# Patient Record
Sex: Male | Born: 1990 | Race: Black or African American | Hispanic: No | Marital: Single | Smoking: Current every day smoker
Health system: Southern US, Community
[De-identification: ages and names within clinical notes are randomized; demographics above are authoritative.]

## PROBLEM LIST (undated history)

## (undated) DIAGNOSIS — J45909 Unspecified asthma, uncomplicated: Secondary | ICD-10-CM

---
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
## 2010-05-06 ENCOUNTER — Emergency Department (HOSPITAL_COMMUNITY)
Admission: EM | Admit: 2010-05-06 | Discharge: 2010-05-06 | Payer: Self-pay | Source: Home / Self Care | Admitting: Emergency Medicine

## 2010-05-13 LAB — CBC
HCT: 45.9 % (ref 39.0–52.0)
Hemoglobin: 14.5 g/dL (ref 13.0–17.0)
MCH: 26.4 pg (ref 26.0–34.0)
MCHC: 31.6 g/dL (ref 30.0–36.0)
MCV: 83.6 fL (ref 78.0–100.0)
Platelets: 232 10*3/uL (ref 150–400)
RBC: 5.49 MIL/uL (ref 4.22–5.81)
RDW: 13.3 % (ref 11.5–15.5)
WBC: 4.7 10*3/uL (ref 4.0–10.5)

## 2010-05-13 LAB — DIFFERENTIAL
Basophils Absolute: 0 10*3/uL (ref 0.0–0.1)
Basophils Relative: 1 % (ref 0–1)
Eosinophils Absolute: 0.1 10*3/uL (ref 0.0–0.7)
Eosinophils Relative: 1 % (ref 0–5)
Lymphs Abs: 1.1 10*3/uL (ref 0.7–4.0)
Monocytes Absolute: 0.7 10*3/uL (ref 0.1–1.0)
Monocytes Relative: 15 % — ABNORMAL HIGH (ref 3–12)
Neutro Abs: 2.7 10*3/uL (ref 1.7–7.7)
Neutrophils Relative %: 59 % (ref 43–77)

## 2010-07-19 LAB — RPR: RPR Ser Ql: NONREACTIVE

## 2010-07-31 LAB — RAPID STREP SCREEN (MED CTR MEBANE ONLY): Streptococcus, Group A Screen (Direct): NEGATIVE

## 2010-09-13 NOTE — H&P (Signed)
NAME:  LEVITICUS, HARTON NO.:  [id number]   MEDICAL RECORD NO.:  [id number]          PATIENT TYPE:  INP   LOCATION:  0204                          FACILITY:  [facility]   PHYSICIAN:  Lalla Brothers, MDDATE OF BIRTH:  1990-09-13   DATE OF ADMISSION:  03/07/2006  DATE OF DISCHARGE:                         PSYCHIATRIC ADMISSION ASSESSMENT   IDENTIFICATION:  Al-Bari Skop is a 20 year old African-American young  male who was a 10th grader in [employer], living with his mother.  His chief complaints are depression and suicidal ideation and substance  abuse.   HISTORY OF PRESENT ILLNESS:  Al-Bari is a 20 year old black male who  presented with symptoms of depression, oppositional defiant behavior,  substance abuse, skipping school, interpersonal relationship problems,  brought in by his mother to the [facility] and initially  screened by the evaluation team and admitted into the [facility] on a voluntary basis with a diagnosis of mood disorder, not  otherwise specified, and suicidal ideations with a plan of cutting himself.  The patient made a threatening statement that he wanted to kill himself with  cutting his wrists.  The patient reported that he has been having  significant stress, depression, crying spells, causing him not to sleep well  and getting irritable, not able to concentrate, and suicidal thoughts.  He  stated that he felt like his mom does not want him because he is being given  lot of punishments as a disciplinary method, taking away stuff, grounding  him more frequently.  He became rebellious since this summer, started  smoking marijuana, selling marijuana, skipping school, hanging around with  wrong people.  He was suspended from the school at least twice from [employer].  His mom got upset and mad with him.  He has been more  argumentative with stress affecting oppositional and defiant with  her.  The  patient stated that he does not want to go to school, saying that it is  boring, is not interesting anymore, and he likes selling the marijuana and  making money and is keeping his money for himself, but could not reveal the  purpose of saving money and what makes him bored in the classroom.  He  stated that he has hallucinations about three days ago, a male voice  telling him to hurt himself.  The patient's mother is worried even more  about the patient as he is stating the need for help.   PAST PSYCHIATRIC HISTORY:  The patient reported he was never being treated  either at acute inpatient psychiatric hospital or outpatient psychiatric  treatment.   PAST MEDICAL HISTORY:  1. The patient has been diagnosed with asthma and seasonal allergies.  2. The patient reported he had a seizure about eight years ago but no      further treatment required.  He reported no head injuries or motor      vehicle accident.   SUBSTANCE ABUSE HISTORY:  The patient reported that he has been using  marijuana since the summer and has stated that he is using once or twice a  week and he is also selling outside the school, but has denied any charges  against selling or using drugs.   ALLERGIES:  He has no known drug allergies.   CURRENT MEDICATIONS:  1. Allegra as needed.  2. Advair as needed.   FAMILY HISTORY:  Significant for depression in his biological mother who has  been admitted at the [facility] a few times.  The patient's  dad lives with his 74 year old half sister in [place].  He has a 23-year-  old brother who was killed in a motor vehicle accident about eight years  ago.  He has a 33 year old brother grown up, living on his own, as he has no  relationship with him.  He stated he has a fairly good relationship with his  biological father and visits him during the holidays.  He stated he has a  relationship problem with his mother.  It is okay except arguments  and  defiant behavior related to disciplinary methods.  The patient denied any  history of victim of abuse or legal history.   MENTAL STATUS EXAM:  This is a 20 year old African-American male who  appeared as per stated age, tall, thin, no dysmorphism or no involuntary  movements.  He has short hair, casually dressed, fairly groomed.  His motor  activity mildly increased.  He has poor eye contact during the first few  minutes of the interview.  His relationship to the examiner gradually  improved over the interview time.  His stated mood is irritable.  His affect  is appropriate, no lability.  Intensity is moderate.  He has normal rate,  rhythm, and volume of speech.  His thought processes are linear, goal-  directed without loosening of associations or flight of ideas.  Thought  content:  He wanted to cut himself because he feels his mother does not like  him, does not want him.  He also had a plan of cutting his wrists.  He  reported hearing auditory hallucinations about three days ago but denied  currently.  He denied any delusional content of his thoughts.  Cognition  seems to be intact.  He is awake, alert, oriented to time, place, and  person.  He has good concentration, memory, and abstract thinking based on  formal questions.   DIAGNOSES:  Axis I:  1. Mood disorder, not otherwise specified.  2. Psychotic disorder, psychosis, not otherwise specified.  3. Marijuana abuse.  4. Substance-induced mood disorder.  Axis II:  Deferred.  Axis III:  1. Asthma.  2. Seasonal allergies.  Axis IV:  Academic and behavior problems, community support, family support,  substance abuse, parent-child relation problems.  Axis V:  Less than 30.   Estimated length of inpatient treatment:  5-7 days.   TREATMENT RECOMMENDATIONS:  Recommended to provide safe and secure  environment and therapeutic milieu.  The patient will be receiving multisystemic, multiple model treatment modalities including  individual  therapy, group therapy, family therapy, and substance abuse counseling.  The  patient will be discharged when free from depression, anxiety, stress, and  suicidal ideations.  During the family therapy medication treatment will be  discussed.  Upon agreement may start anticipated medications.    ______________________________  Conni Slipper, MD      Lalla Brothers, MD  Electronically Signed   JRJ/MEDQ  D:  03/08/2006  T:  03/08/2006  Job:  [id number]

## 2010-09-13 NOTE — Discharge Summary (Signed)
NAME:  Zachary Robinson, Zachary Robinson NO.:  [id number]   MEDICAL RECORD NO.:  [id number]          PATIENT TYPE:  INP   LOCATION:  0204                          FACILITY:  [facility]   PHYSICIAN:  Lalla Brothers, MDDATE OF BIRTH:  1990/11/02   DATE OF ADMISSION:  03/07/2006  DATE OF DISCHARGE:  03/14/2006                                 DISCHARGE SUMMARY   IDENTIFICATION:  A 40-20/20-year-old male ninth grade student at [employer] though failing and skipping was admitted emergently voluntarily from  access and intake crisis at [facility] when brought by mother  for suicide plan to cut his wrist and depression.  The patient had been  using and selling cannabis in association with negative peers since the  summer of 05-Oct-2005 with progressive consequences.  He reported having a seizure  8 years ago but required no further evaluation or treatment.  There is  significant family history of depression with mother on medications  requiring recurrent hospitalization for depressive episodes.  For full  details, please see the typed admission assessment by Dr. Elsie Saas.   SYNOPSIS OF PRESENT ILLNESS:  The patient resides with mother with father  being in [place].  Brother died in 1999-10-06 in an auto accident. The patient  did have some therapy at that time being very close to the brother.  The  family moved from [place] to [place] when the patient was 20 years  of age and maternal grandfather died in 66 and godmother and 86.  Mother  knows the patient is depressed but he copes primarily by oppositionality and  cannabis.  The patient feels like a failure.  There is a maternal family  history of schizophrenia and borderline personality including maternal  grandmother having mental illness as well as maternal grandfather substance  abuse.  Mother reports that the patient's last therapy was 2 years ago.  The  patient has used Allegra and Advair as needed for  allergic rhinitis and  asthma.  He is also taking Vistaril for insomnia at times.  He had a left  upper extremity fracture in 10/06/02.  He has also had an albuterol inhaler as  needed.  He acknowledges sexual activity.   INITIAL MENTAL STATUS EXAM:  The patient had mild to moderate depression  initially though with poor eye contact and limited verbal elaboration on  questions.  He was irritable, wanting to cut himself because he feels mother  does not want or like him.  He acknowledged a plan to cut his wrist.  He  reported auditory hallucinations 3 days before but none subsequently.  He  had no hypomanic diathesis.   LABORATORY FINDINGS:  CBC was normal except eosinophils elevated at 5% with  upper limit of normal 4%.  White count was normal at 6000, hemoglobin 13.7,  MCV of 82 and platelet count 275,000.  Comprehensive metabolic panel was  normal with sodium 138, potassium 3.7, random glucose 100, creatinine 0.9,  indirect bilirubin 0.8, calcium 9.8, albumin 3.8, AST 18 and ALT 10.  Free  T4 was normal at 1.29 and TSH at 3.682.  Urine  drug screen was positive for  marijuana metabolites quantitated and confirmed at 8 ng/mL otherwise drug  screen was negative with creatinine of 165 mg/dL documenting adequate  specimen.  Urinalysis revealed specific gravity of 1.019 with pH 6,  otherwise negative except moderate leukocyte esterase and 11-20 WBC with  mucus present.  Urine probe for gonorrhea and chlamydia trachomatis by DNA  amplification was positive for chlamydia but negative for gonorrhea.  RPR  was nonreactive..  Electrocardiogram 3 days prior to discharge for  bradycardia at rest ranging from 45-68 beats per minute revealed normal  sinus rhythm, normal EKG with rate of 69, PR of 150, QRS of 94 and QTC 394  milliseconds as interpreted by Dr. Rosiland Oz.   HOSPITAL COURSE AND TREATMENT:  General medical exam by Jorje Guild, PA-C  revealed no asthma evident at this time.  The patient  reported cannabis  weekly.  His BMI was 21.4.  Exam was otherwise intact.  Height was 68 inches  with weight of 141 pounds with subsequent weight 140 pounds.  Blood pressure  was initially 108/59 with heart rate of 64 supine and 101/52 with heart rate  of 74 standing.  On the fourth hospital day, supine blood pressure was  111/66 with heart rate of 45 and standing blood pressure 104/63 with heart  rate of 70.  On the day of discharge, supine blood pressure was 112/60 with  heart rate of 59 and standing blood pressure 101/55 with heart rate of 95.  Mother and patient repeatedly declined Wellbutrin, Luvox or alternative  antidepressant for the patient.  The patient remained somewhat guarded  throughout the hospital stay though he did open up more in group and milieu  therapies as a hospitalization proceeded.  He did open up in family therapy  with mother and mother reached a conclusion that the patient would switch to  adult high school.  The patient did allow treatment process to address his  intrapsychic conflicts and to reestablish a loving and trusting relationship  with mother.  The need for sobriety from cannabis was addressed and the  reality of the significance of his current drug level of cannabis was  addressed.  He was treated with Zithromax 1000 mg for his chlamydia and  retained and tolerated it well.  He was educated on the chlamydia and shared  this information with mother and will address it with his contacts as well.  Suicidal ideation remitted and he required no treatment for asthma during  the hospital stay.  He was discharged in improved condition requiring no  seclusion or restraint during the hospital stay.  He had no suicide or  homicidal ideation at the time of discharge and had no hallucinations  throughout the hospital stay.   FINAL DIAGNOSES:  AXIS I:  1. Dysthymic disorder, early onset, severe with atypical features.  2. Oppositional defiant disorder.  3.  Cannabis abuse. 4. Other interpersonal problem.  5. Parent child problem.  6. Other specified family circumstances.  AXIS II:  Diagnosis deferred.  AXIS III:  1. Seasonal allergic rhinitis and asthma.  2. Asymptomatic chlamydia urethritis.  3. Resting sinus bradycardia in the morning of no clinical significance.  AXIS IV:  Stressors, family severe acute and chronic; school severe acute  and chronic; phase of life severe acute and chronic.  AXIS V:  GAF on admission 30 with highest in the last year estimated at 62  and discharge GAF was 53.   PLAN:  The patient was  discharged to mother in improved condition free of  suicidal ideation.  He follows a regular diet and has no restriction on  physical activity.  Crisis and safety plans are outlined if needed.  He has  albuterol and Advair inhalers and Allegra at home if needed for his allergic  rhinitis and asthma.  He will assure that his contacts are assessed and  treated relative to the chlamydia urethritis.  He will abstain from cannabis  and continue to address anger management and substance abuse in his ongoing  aftercare as well as his depression.  He will see Del[facility] Gr[facility] March 25, 2006 at 10:00 a.m. and can  have psychiatric follow-up if he and mother do become willing to start  pharmacotherapy for depression.  Such as Luvox or Wellbutrin.  Crisis and  safety plans are outlined if needed.      Lalla Brothers, MD  Electronically Signed     GEJ/MEDQ  D:  03/18/2006  T:  03/18/2006  Job:  [id number]   cc:   Delphia Grates  [facility]  [address]  [place], [place] [address]

## 2011-01-03 ENCOUNTER — Emergency Department (HOSPITAL_COMMUNITY)
Admission: EM | Admit: 2011-01-03 | Discharge: 2011-01-03 | Disposition: A | Discharge status: Home / Self Care | Payer: Self-pay | Attending: Emergency Medicine | Admitting: Emergency Medicine

## 2011-01-03 DIAGNOSIS — R6883 Chills (without fever): Secondary | ICD-10-CM | POA: Insufficient documentation

## 2011-01-03 DIAGNOSIS — R05 Cough: Secondary | ICD-10-CM | POA: Insufficient documentation

## 2011-01-03 DIAGNOSIS — R059 Cough, unspecified: Secondary | ICD-10-CM | POA: Insufficient documentation

## 2011-01-03 DIAGNOSIS — R599 Enlarged lymph nodes, unspecified: Secondary | ICD-10-CM | POA: Insufficient documentation

## 2011-01-03 DIAGNOSIS — J029 Acute pharyngitis, unspecified: Secondary | ICD-10-CM | POA: Insufficient documentation

## 2011-01-03 LAB — RAPID STREP SCREEN (MED CTR MEBANE ONLY): Streptococcus, Group A Screen (Direct): NEGATIVE

## 2011-01-27 LAB — RAPID STREP SCREEN (MED CTR MEBANE ONLY): Streptococcus, Group A Screen (Direct): NEGATIVE

## 2011-02-07 LAB — STREP A DNA PROBE: Group A Strep Probe: NEGATIVE

## 2011-02-07 LAB — RAPID STREP SCREEN (MED CTR MEBANE ONLY): Streptococcus, Group A Screen (Direct): NEGATIVE

## 2012-08-04 ENCOUNTER — Emergency Department (HOSPITAL_COMMUNITY)
Admission: EM | Admit: 2012-08-04 | Discharge: 2012-08-05 | Disposition: A | Discharge status: Home / Self Care | Payer: Self-pay | Attending: Emergency Medicine | Admitting: Emergency Medicine

## 2012-08-04 ENCOUNTER — Encounter (HOSPITAL_COMMUNITY): Payer: Self-pay | Admitting: Emergency Medicine

## 2012-08-04 DIAGNOSIS — F17200 Nicotine dependence, unspecified, uncomplicated: Secondary | ICD-10-CM | POA: Insufficient documentation

## 2012-08-04 DIAGNOSIS — K121 Other forms of stomatitis: Secondary | ICD-10-CM | POA: Insufficient documentation

## 2012-08-04 DIAGNOSIS — K1230 Oral mucositis (ulcerative), unspecified: Secondary | ICD-10-CM | POA: Insufficient documentation

## 2012-08-04 DIAGNOSIS — B079 Viral wart, unspecified: Secondary | ICD-10-CM | POA: Insufficient documentation

## 2012-08-04 NOTE — ED Notes (Signed)
Pt ambulatory to br

## 2012-08-04 NOTE — ED Notes (Signed)
Pt requesting STD testing.  Reports bumps on the back of his tongue and bumps on penis x 3 weeks.  Also reports burning with urination.

## 2012-08-05 MED ORDER — NYSTATIN 100000 UNIT/ML MT SUSP: 5.0000 mL | Freq: Four times a day (QID) | OROMUCOSAL | Status: AC

## 2012-08-05 MED ORDER — NYSTATIN 100000 UNIT/ML MT SUSP
5.0000 mL | Freq: Once | OROMUCOSAL | Status: AC
  Administered 2012-08-05: 500000 [IU] via ORAL
  Filled 2012-08-05: qty 5

## 2012-08-05 NOTE — ED Notes (Signed)
Pt dc to home.   Pt states understanding to dc instructions.  Pt ambulatory to exit without difficulty.  Pt denies need for w/c. 

## 2012-08-05 NOTE — ED Provider Notes (Signed)
History     CSN: [id number]  Arrival date & time 08/04/12  2124   First MD Initiated Contact with Patient 08/05/12 0036      Chief Complaint  Patient presents with  . SEXUALLY TRANSMITTED DISEASE    (Consider location/radiation/quality/duration/timing/severity/associated sxs/prior treatment) HPI Comments: Patient presents reporting, "bumps" on the shaft of his penis.  They are nonpainful and dark in color.  He notes that 3-4 weeks, ago.  He also has had some oral soreness, and lesions.  His sexual partner.  Does not have the symptoms vaginal discharge vaginal lesions  The history is provided by the patient.    History reviewed. No pertinent past medical history.  History reviewed. No pertinent past surgical history.  No family history on file.  History  Substance Use Topics  . Smoking status: Current Every Day Smoker  . Smokeless tobacco: Not on file  . Alcohol Use: No      Review of Systems  Constitutional: Negative for fever.  HENT: Positive for mouth sores. Negative for sore throat and trouble swallowing.   Genitourinary: Negative for dysuria, penile swelling, scrotal swelling, penile pain and testicular pain.  All other systems reviewed and are negative.    Allergies  Review of patient's allergies indicates not on file.  Home Medications   Current Outpatient Rx  Name  Route  Sig  Dispense  Refill  . nystatin (MYCOSTATIN) 100000 UNIT/ML suspension   Oral   Take 5 mLs (500,000 Units total) by mouth 4 (four) times daily.   60 mL   0     BP 116/48  Pulse 77  Temp(Src) 98.4 F (36.9 C) (Oral)  SpO2 100%  Physical Exam  Nursing note and vitals reviewed. Constitutional: He appears well-developed.  HENT:  Head: Normocephalic.  At the very posterior edges of the tongue.  Patient has 2 reddened shiny, nonpainful lesions, consistent with stomatitis.  We'll treat with nystatin  Eyes: Pupils are equal, round, and reactive to light.  Neck: Normal range of  motion.  Cardiovascular: Normal rate.   Pulmonary/Chest: Effort normal.  Abdominal: Soft.  Genitourinary: Penis normal. No penile tenderness.  Patient has to work late, lesions on the shaft of his penis or nonpainful without surrounding erythema  Musculoskeletal: Normal range of motion.  Neurological: He is alert.  Skin: Skin is warm.    ED Course  Procedures (including critical care time)  Labs Reviewed - No data to display No results found.   1. Viral warts, unspecified   2. Stomatitis       MDM   Patient.  Has not had any penile discharge or lesions are inconsistent with a syphilitic chancre.  Or herpes lesions        Arman Filter, NP 08/05/12 [phone]

## 2012-08-05 NOTE — ED Provider Notes (Signed)
Medical screening examination/treatment/procedure(s) were performed by non-physician practitioner and as supervising physician I was immediately available for consultation/collaboration.   David H Yao, MD 08/05/12 0602 

## 2013-01-08 ENCOUNTER — Encounter (HOSPITAL_COMMUNITY): Payer: Self-pay

## 2013-01-08 ENCOUNTER — Emergency Department (HOSPITAL_COMMUNITY)
Admission: EM | Admit: 2013-01-08 | Discharge: 2013-01-08 | Disposition: A | Discharge status: Home / Self Care | Payer: Self-pay | Attending: Emergency Medicine | Admitting: Emergency Medicine

## 2013-01-08 DIAGNOSIS — R369 Urethral discharge, unspecified: Secondary | ICD-10-CM

## 2013-01-08 DIAGNOSIS — A64 Unspecified sexually transmitted disease: Secondary | ICD-10-CM | POA: Insufficient documentation

## 2013-01-08 DIAGNOSIS — J45909 Unspecified asthma, uncomplicated: Secondary | ICD-10-CM | POA: Insufficient documentation

## 2013-01-08 DIAGNOSIS — F17200 Nicotine dependence, unspecified, uncomplicated: Secondary | ICD-10-CM | POA: Insufficient documentation

## 2013-01-08 HISTORY — DX: Unspecified asthma, uncomplicated: J45.909

## 2013-01-08 LAB — URINALYSIS, ROUTINE W REFLEX MICROSCOPIC
Bilirubin Urine: NEGATIVE
Glucose, UA: NEGATIVE mg/dL
Hgb urine dipstick: NEGATIVE
Ketones, ur: NEGATIVE mg/dL
Nitrite: NEGATIVE
Protein, ur: 100 mg/dL — AB
Specific Gravity, Urine: 1.034 — ABNORMAL HIGH (ref 1.005–1.030)
Urobilinogen, UA: 1 mg/dL (ref 0.0–1.0)
pH: 5.5 (ref 5.0–8.0)

## 2013-01-08 LAB — URINE MICROSCOPIC-ADD ON

## 2013-01-08 MED ORDER — DOXYCYCLINE HYCLATE 100 MG PO CAPS: 100.0000 mg | ORAL_CAPSULE | Freq: Two times a day (BID) | ORAL | Status: DC

## 2013-01-08 MED ORDER — CEFTRIAXONE SODIUM 250 MG IJ SOLR
250.0000 mg | Freq: Once | INTRAMUSCULAR | Status: AC
  Administered 2013-01-08: 250 mg via INTRAMUSCULAR

## 2013-01-08 NOTE — ED Provider Notes (Signed)
CSN: [id number]     Arrival date & time 01/08/13  0306 History   First MD Initiated Contact with Patient 01/08/13 0404     Chief Complaint  Patient presents with  . Penile Discharge   (Consider location/radiation/quality/duration/timing/severity/associated sxs/prior Treatment) HPI Patient arrives via EMS for complaints of greenish yellow penile discharge that he noticed tonight. He's had burning with urination. Patient states he is sexually active and does not use condoms. He denies any type of genital masses or lesions. Denies abdominal pain, nausea, vomiting, diarrhea, fevers, chills. Patient has occasional testicular pain but states he is having no pain currently. Patient has been treated for Chlamydia in the past. Past Medical History  Diagnosis Date  . Asthma    History reviewed. No pertinent past surgical history. History reviewed. No pertinent family history. History  Substance Use Topics  . Smoking status: Current Every Day Smoker  . Smokeless tobacco: Not on file  . Alcohol Use: No    Review of Systems  Constitutional: Negative for fever.  Gastrointestinal: Negative for nausea, vomiting and abdominal pain.  Genitourinary: Positive for dysuria, discharge and testicular pain. Negative for frequency and hematuria.  Skin: Negative for rash and wound.  All other systems reviewed and are negative.    Allergies  Review of patient's allergies indicates no known allergies.  Home Medications   Current Outpatient Rx  Name  Route  Sig  Dispense  Refill  . ibuprofen (ADVIL,MOTRIN) 200 MG tablet   Oral   Take 400 mg by mouth every 6 (six) hours as needed for pain or fever.         . doxycycline (VIBRAMYCIN) 100 MG capsule   Oral   Take 1 capsule (100 mg total) by mouth 2 (two) times daily.   20 capsule   0    BP 100/42  Pulse 69  Temp(Src) 98.5 F (36.9 C) (Oral)  Resp 18  SpO2 98% Physical Exam  Nursing note and vitals reviewed. Constitutional: He is oriented  to person, place, and time. He appears well-developed and well-nourished. No distress.  HENT:  Head: Normocephalic and atraumatic.  Mouth/Throat: Oropharynx is clear and moist.  Eyes: EOM are normal. Pupils are equal, round, and reactive to light.  Neck: Normal range of motion. Neck supple.  Cardiovascular: Normal rate and regular rhythm.   Pulmonary/Chest: Effort normal and breath sounds normal. No respiratory distress. He has no wheezes. He has no rales.  Abdominal: Soft. Bowel sounds are normal. He exhibits no distension and no mass. There is no tenderness. There is no rebound and no guarding.  Genitourinary: No penile tenderness.  No inguinal lymphadenopathy. No genital lesions. The testicular tenderness or swelling. Patient has a white penile discharge.  Musculoskeletal: Normal range of motion. He exhibits no edema and no tenderness.  Neurological: He is alert and oriented to person, place, and time.  Skin: Skin is warm and dry. No rash noted. No erythema.  Psychiatric: He has a normal mood and affect. His behavior is normal.    ED Course  Procedures (including critical care time) Labs Review Labs Reviewed  URINALYSIS, ROUTINE W REFLEX MICROSCOPIC - Abnormal; Notable for the following:    Color, Urine AMBER (*)    APPearance CLOUDY (*)    Specific Gravity, Urine 1.034 (*)    Protein, ur 100 (*)    Leukocytes, UA LARGE (*)    All other components within normal limits  URINE MICROSCOPIC-ADD ON - Abnormal; Notable for the following:  Casts HYALINE CASTS (*)    All other components within normal limits  GC/CHLAMYDIA PROBE AMP  URINE CULTURE   Imaging Review No results found.  MDM  Given penal discharge go ahead and treat for STD. Patient is encouraged to have sexual partners evaluated at the health clinic and treated. Safe sex practices have been encouraged.    Loren Racer, MD 01/08/13 [phone]

## 2013-01-08 NOTE — ED Notes (Signed)
Per EMS, pt called 911 without mother's knowledge.  Pt was in bed asleep.  Pt woke up to go to bathroom and had a yellowish-greenish d/c from penis.  Pt states no difficulty urinating but did have pressure in abdomen after voiding.  Vitals:  110 bp palpated, 82, 16, 100%

## 2013-01-08 NOTE — ED Notes (Signed)
Pt states, up in middle of night to use restroom and noticed the d/c.  Pt states sexually active and not using condoms currently.  Pt has been with partner for 2 months.

## 2013-01-09 LAB — URINE CULTURE
Colony Count: NO GROWTH
Culture: NO GROWTH

## 2013-01-10 LAB — GC/CHLAMYDIA PROBE AMP
CT Probe RNA: POSITIVE — AB
GC Probe RNA: POSITIVE — AB

## 2013-01-15 ENCOUNTER — Telehealth (HOSPITAL_COMMUNITY): Payer: Self-pay | Admitting: *Deleted

## 2013-01-16 ENCOUNTER — Telehealth (HOSPITAL_COMMUNITY): Payer: Self-pay | Admitting: Emergency Medicine

## 2013-01-18 NOTE — ED Notes (Signed)
Patient informed of positive results after IDx 2 and informed of need to notify partner to be treated.

## 2013-04-05 ENCOUNTER — Encounter (HOSPITAL_COMMUNITY): Payer: Self-pay | Admitting: Emergency Medicine

## 2013-04-05 ENCOUNTER — Emergency Department (HOSPITAL_COMMUNITY)
Admission: EM | Admit: 2013-04-05 | Discharge: 2013-04-05 | Disposition: A | Discharge status: Home / Self Care | Payer: Self-pay | Attending: Emergency Medicine | Admitting: Emergency Medicine

## 2013-04-05 DIAGNOSIS — J45909 Unspecified asthma, uncomplicated: Secondary | ICD-10-CM | POA: Insufficient documentation

## 2013-04-05 DIAGNOSIS — J020 Streptococcal pharyngitis: Secondary | ICD-10-CM | POA: Insufficient documentation

## 2013-04-05 DIAGNOSIS — Z792 Long term (current) use of antibiotics: Secondary | ICD-10-CM | POA: Insufficient documentation

## 2013-04-05 DIAGNOSIS — F17200 Nicotine dependence, unspecified, uncomplicated: Secondary | ICD-10-CM | POA: Insufficient documentation

## 2013-04-05 LAB — RAPID STREP SCREEN (MED CTR MEBANE ONLY): Streptococcus, Group A Screen (Direct): NEGATIVE

## 2013-04-05 MED ORDER — IBUPROFEN 600 MG PO TABS: 600.0000 mg | ORAL_TABLET | Freq: Four times a day (QID) | ORAL | Status: DC | PRN

## 2013-04-05 MED ORDER — TRAMADOL HCL 50 MG PO TABS: 50.0000 mg | ORAL_TABLET | Freq: Four times a day (QID) | ORAL | Status: DC | PRN

## 2013-04-05 MED ORDER — PENICILLIN G BENZATHINE 1200000 UNIT/2ML IM SUSP
1.2000 10*6.[IU] | Freq: Once | INTRAMUSCULAR | Status: AC
  Administered 2013-04-05: 1.2 10*6.[IU] via INTRAMUSCULAR
  Filled 2013-04-05: qty 2

## 2013-04-05 NOTE — ED Provider Notes (Signed)
CSN: [id number]     Arrival date & time 04/05/13  0840 History  This chart was scribed for non-physician practitioner working with Derwood Kaplan, MD by Ashley Jacobs, ED scribe. This patient was seen in room TR06C/TR06C and the patient's care was started at 9:17 AM.  None    Chief Complaint  Patient presents with  . Sore Throat   (Consider location/radiation/quality/duration/timing/severity/associated sxs/prior Treatment) HPI HPI Comments: Zachary Robinson is a 22 y.o. male who presents to the Emergency Department complaining of constant, severe throat pain for the past three days. He reports the associated symptoms of chills, fever (102), and intermittent cough. Pt states feeling a "bump" at the back of his throat that is painful and irritating. He has tried Advil for pain with temporary relief. Pt smokes tobacco every day and does not drink alcohol. He does not have any known allergies to mediations or any prior medical complications.  Past Medical History  Diagnosis Date  . Asthma    History reviewed. No pertinent past surgical history. History reviewed. No pertinent family history. History  Substance Use Topics  . Smoking status: Current Every Day Smoker  . Smokeless tobacco: Not on file  . Alcohol Use: No    Review of Systems  Constitutional: Positive for fever and chills. Negative for fatigue.  HENT: Positive for sore throat. Negative for congestion, ear pain, rhinorrhea and sinus pressure.   Eyes: Negative for redness.  Respiratory: Positive for cough. Negative for wheezing.   Gastrointestinal: Negative for nausea, vomiting, abdominal pain and diarrhea.  Genitourinary: Negative for dysuria.  Musculoskeletal: Negative for myalgias and neck stiffness.  Skin: Negative for rash.  Neurological: Negative for headaches.  Hematological: Negative for adenopathy.    Allergies  Review of patient's allergies indicates no known allergies.  Home Medications   Current  Outpatient Rx  Name  Route  Sig  Dispense  Refill  . doxycycline (VIBRAMYCIN) 100 MG capsule   Oral   Take 1 capsule (100 mg total) by mouth 2 (two) times daily.   20 capsule   0   . ibuprofen (ADVIL,MOTRIN) 200 MG tablet   Oral   Take 400 mg by mouth every 6 (six) hours as needed for pain or fever.          BP 102/52  Pulse 75  Temp(Src) 99.1 F (37.3 C) (Oral)  Resp 18  SpO2 100% Physical Exam  Nursing note and vitals reviewed. Constitutional: He appears well-developed and well-nourished. No distress.  HENT:  Head: Normocephalic and atraumatic.  Mouth/Throat: No uvula swelling. Oropharyngeal exudate, posterior oropharyngeal edema and posterior oropharyngeal erythema present. No tonsillar abscesses.  Eyes: EOM are normal. Pupils are equal, round, and reactive to light.  Neck: Normal range of motion. Neck supple. Erythema present. No tracheal deviation present.  Erythema of the tonsils with exudates  cervical adenopathy present  Cardiovascular: Normal rate.   Pulmonary/Chest: Effort normal. No respiratory distress.  Abdominal: Soft. He exhibits no distension.  Musculoskeletal: Normal range of motion.  Lymphadenopathy:    He has cervical adenopathy.  Neurological: He is alert.  Skin: Skin is warm and dry.  Psychiatric: He has a normal mood and affect. His behavior is normal.    ED Course  Procedures (including critical care time) DIAGNOSTIC STUDIES: Oxygen Saturation is 100% on room air, normal by my interpretation.    COORDINATION OF CARE: 9:21 AM Discussed course of care with pt which includes Rapid Strep test, Bicillin LA injection, Motrin 600 mg and Ultram  50 mg. Pt understands and agrees.  Labs Review Labs Reviewed  RAPID STREP SCREEN  CULTURE, GROUP A STREP   Imaging Review No results found.  EKG Interpretation   None      Patient seen and examined. CENTOR 4/4. Pt agrees with IM Bicillin treatment. Medications ordered.   Vital signs reviewed and  are as follows: Filed Vitals:   04/05/13 0853  BP: 102/52  Pulse: 75  Temp: 99.1 F (37.3 C)  Resp: 18   Patient urged to return with worsening symptoms or other concerns. Patient verbalized understanding and agrees with plan.   Patient counseled on use of narcotic pain medications. Counseled not to combine these medications with others containing tylenol. Urged not to drink alcohol, drive, or perform any other activities that requires focus while taking these medications. The patient verbalizes understanding and agrees with the plan.   MDM   1. Streptococcal pharyngitis    CENTOR 4/4, no complicating abscess. No resp compromise. Non-toxic appearance.   I personally performed the services described in this documentation, which was scribed in my presence. The recorded information has been reviewed and is accurate.     Renne Crigler, PA-C 04/06/13 [phone]

## 2013-04-05 NOTE — ED Notes (Signed)
Per EMS: pt from home c/o sore throat with fever x 2 days

## 2013-04-07 LAB — CULTURE, GROUP A STREP

## 2013-04-08 NOTE — ED Provider Notes (Signed)
Medical screening examination/treatment/procedure(s) were performed by non-physician practitioner and as supervising physician I was immediately available for consultation/collaboration.  EKG Interpretation   None        Raylee Strehl, MD 04/08/13 1803 

## 2014-07-08 ENCOUNTER — Emergency Department (HOSPITAL_COMMUNITY)
Admission: EM | Admit: 2014-07-08 | Discharge: 2014-07-08 | Disposition: A | Discharge status: Home / Self Care | Payer: Self-pay | Attending: Emergency Medicine | Admitting: Emergency Medicine

## 2014-07-08 ENCOUNTER — Encounter (HOSPITAL_COMMUNITY): Payer: Self-pay | Admitting: Family Medicine

## 2014-07-08 DIAGNOSIS — Z720 Tobacco use: Secondary | ICD-10-CM | POA: Insufficient documentation

## 2014-07-08 DIAGNOSIS — N342 Other urethritis: Secondary | ICD-10-CM | POA: Insufficient documentation

## 2014-07-08 DIAGNOSIS — J45909 Unspecified asthma, uncomplicated: Secondary | ICD-10-CM | POA: Insufficient documentation

## 2014-07-08 DIAGNOSIS — R369 Urethral discharge, unspecified: Secondary | ICD-10-CM | POA: Insufficient documentation

## 2014-07-08 DIAGNOSIS — Z202 Contact with and (suspected) exposure to infections with a predominantly sexual mode of transmission: Secondary | ICD-10-CM | POA: Insufficient documentation

## 2014-07-08 MED ORDER — LIDOCAINE HCL (PF) 1 % IJ SOLN
5.0000 mL | Freq: Once | INTRAMUSCULAR | Status: AC
  Administered 2014-07-08: 5 mL

## 2014-07-08 MED ORDER — CEFTRIAXONE SODIUM 250 MG IJ SOLR
250.0000 mg | Freq: Once | INTRAMUSCULAR | Status: AC
  Administered 2014-07-08: 250 mg via INTRAMUSCULAR
  Filled 2014-07-08: qty 250

## 2014-07-08 MED ORDER — AZITHROMYCIN 250 MG PO TABS
1000.0000 mg | ORAL_TABLET | Freq: Once | ORAL | Status: AC
  Administered 2014-07-08: 1000 mg via ORAL
  Filled 2014-07-08: qty 4

## 2014-07-08 MED ORDER — LIDOCAINE HCL (PF) 1 % IJ SOLN
INTRAMUSCULAR | Status: AC
  Administered 2014-07-08: 12:00:00 5 mL
  Filled 2014-07-08: qty 5

## 2014-07-08 NOTE — Discharge Instructions (Signed)
You were not tested for all STDs today. Your gonorrhea and chlamydia tests are pending- if they are positive, you will receive a phone call. Refrain from sex until you have the results from a full STD screen. Please go to [facility] (Address: [address], [place], [place] [address] Phone: [phone]) or see the [facility] (Address: [address] Phone: [phone]) for full STD screening. Return to the emergency room for worsening of symptoms, fever, and vomiting. ° °Do not hesitate to return to the emergency room for any new, worsening or concerning symptoms. ° °Please obtain primary care using resource guide below. But the minute you were seen in the emergency room and that they will need to obtain records for further outpatient management. ° ° ° °Emergency Department Resource Guide °1) Find a Doctor and Pay Out of Pocket °Although you won't have to find out who is covered by your insurance plan, it is a good idea to ask around and get recommendations. You will then need to call the office and see if the doctor you have chosen will accept you as a new patient and what types of options they offer for patients who are self-pay. Some doctors offer discounts or will set up payment plans for their patients who do not have insurance, but you will need to ask so you aren't surprised when you get to your appointment. ° °2) Contact Your Local Health Department °Not all health departments have doctors that can see patients for sick visits, but many do, so it is worth a call to see if yours does. If you don't know where your local health department is, you can check in your phone book. The CDC also has a tool to help you locate your state's health department, and many state websites also have listings of all of their local health departments. ° °3) Find a Walk-in Clinic °If your illness is not likely to be very severe or complicated, you may want to try a walk in clinic. These are  popping up all over the country in pharmacies, drugstores, and shopping centers. They're usually staffed by nurse practitioners or physician assistants that have been trained to treat common illnesses and complaints. They're usually fairly quick and inexpensive. However, if you have serious medical issues or chronic medical problems, these are probably not your best option. ° °No Primary Care Doctor: °- Call [facility] at  832-8000 - they can help you locate a primary care doctor that  accepts your insurance, provides certain services, etc. °- Physician Referral Service- 1-800-533-3463 ° °Chronic Pain Problems: °Organization         Address  Phone   Notes  °[facility]  (336) 297-2271 Patients need to be referred by their primary care doctor.  ° °Medication Assistance: °Organization         Address  Phone   Notes  °[place] [facility] [address] °[place], [place] [address] (336) 641-8030 --Must be a resident of [place] °-- Must have NO insurance coverage whatsoever (no Medicaid/ Medicare, etc.) °-- The pt. MUST have a primary care doctor that directs their care regularly and follows them in the community °  °[facility]  (866) 331-1348   °[employer]  (888) 892-1162   ° °Agencies that provide inexpensive medical care: °Organization         Address  Phone   Notes  °[facility]  (336) 832-8035   °[facility]    (336) 832-7272   °[facility] [address] °[place], [place] [address] (336) 832-4777   °[facility] [address], °[place] (336) 271-4999   °[facility]    (336) 373-0678   °[facility]    (336) 272-1050   °[facility] ° [address], [place] Phone:  (336) 832-4444, Fax:  (336) 832-4440 Hours of Operation:  9 am - 6 pm, M-F.  Also accepts Medicaid/Medicare and self-pay.  °[facility] ° [address], [place] Phone: (336) 832-3150, Fax: (336) [phone]. Hours of Operation:  8:30 am - 5:30 pm, M-F.  Also accepts Medicaid and self-pay.  °[facility] [place] [address], [place] Phone: (336) 878-6027   °[facility] [address], [place], [place] (336)723-1848, Ext. 123 Mondays & Thursdays: 7-9 AM.  First 15 patients are seen on a first come, first serve basis. °  ° °Medicaid-accepting [place] Providers: ° °Organization         Address  Phone   Notes  °[facility] [address], [place] (336) 641-2100 Also accepts self-pay patients.  °[facility] [address], [place] ° (336) 856-9996   °[facility] [address], [place] (336) 288-8857   °[facility] [address] [place] [address], [place] (336) 299-7000   °Veita Bland [address], [place]  ° (336) 373-1557 Only accepts [place] Access Medicaid patients after they have their name applied to their card.  ° °Self-Pay (no insurance) in [place]: ° °Organization         Address  Phone   Notes  °Sickle Cell Patients, [facility] [address], [place] (336) 832-1970   °[facility] [address], [place] (336) 832-4400   °[facility] [place] ° [address],  (336) 992-4800   °[facility]/Dr. Osei-Bonsu ° [address] [place] [address], [place] or [address], [place] (336) 841-8500 Phone number for both [place] and [place] locations is the same.  °[facility] [address], [place] (336) 299-0000   °[facility] [address] [place] [address], [place] or [address] (336) 852-7530 °(336) 878-2260   °[facility] [address], [place] (336) 350-1642, phone; (336) 294-5005, fax Sees patients 1st and 3rd Saturday  of every month.  Must not qualify for public or private insurance (i.e. Medicaid, Medicare, [place] Health Choice, Veterans' Benefits) • Household income should be no more than 200% of the poverty level •The clinic cannot treat you if you are pregnant or think you are pregnant • Sexually transmitted diseases are not treated at the clinic.  ° ° °Dental Care: °Organization         Address  Phone  Notes  °[place] [facility] [address],  (336) 641-6152 Accepts children up to age 21 who are enrolled in Medicaid or [place] Health Choice; pregnant women with a Medicaid card; and children who have applied for Medicaid or [place] Health Choice, but were declined, whose parents can pay a reduced fee at time of service.  °[facility] [place]  [address], [place] (336) 641-7733 Accepts children up to age 21 who are enrolled in Medicaid or [place] Health Choice; pregnant women with a Medicaid card; and children who have applied for Medicaid or [place] Health Choice, but were declined, whose parents can pay a reduced fee at time of service.  °[facility] ° [address], [place] (336) 641-4533 Patients are seen by appointment only. Walk-ins are not accepted. [facility] will see patients 18 years of age and older. °Monday - Tuesday (8am-5pm) °Most Wednesdays (8:30-5pm) °$30 per visit, cash only  °[facility] ° [address], [place] (336) 641-4533 Patients are seen by appointment only. Walk-ins are not accepted. [facility] will see patients 18 years of age and older. °One Wednesday Evening (Monthly: Volunteer Based).  $30 per visit, cash only  °[facility]  (919) 537-3737 for adults; Children under age 4, call Graduate Pediatric Dentistry at (919) [phone]. Children aged 4-14, please call (919) [phone] to request a pediatric application. °  Dental services are provided in all areas of dental care including fillings, crowns and bridges, complete and partial dentures, implants, gum treatment, root canals, and extractions. Preventive care is also provided. Treatment is provided to both adults and children. °Patients are selected via a lottery and there is often a waiting list. °  °[facility] [address], °[place] ° (336) 763-8833 [email or web address] °  °[facility] [address], [place], [place] (336)723-1848, Ext. 123 Second and Fourth Thursday of each month, opens at 6:30 AM; Clinic ends at 9 AM.  Patients are seen on a first-come first-served basis, and a limited number are seen during each clinic.  ° °[facility] ° [address], [place], [place] (336) 723-7904   Eligibility Requirements °You must have lived in [place], [place], or [place] counties for at least the last three months. °  You cannot be eligible for state or federal sponsored healthcare insurance, including Veterans Administration, Medicaid, or Medicare. °  You generally cannot be eligible for healthcare insurance through your employer.  °  How to apply: °Eligibility screenings are held every Tuesday and Wednesday afternoon from 1:00 pm until 4:00 pm. You do not need an appointment for the interview!  °[facility] [address], [place], [place] 336-631-2330   °[facility]  336-342-8273   °[facility]  336-703-3100   °[facility]  336-570-6415   ° °Behavioral Health Resources in the Community: °Intensive Outpatient Programs °Organization         Address  Phone  Notes  °[facility] [address], [place], [place] 336-878-6098   °[facility] [address], [place], [place] 336-832-9800   °[facility] [address], [place], [place] ° 336-882-2125   °[facility] [address],  °[place], [place] 1-800-853-5163 or 336-641-4981   °Substance Abuse Resources °Organization         Address  Phone  Notes  °[facility]  336-882-2125   °[facility]  336-784-9470   °[facility]  336-285-9073   °[facility]  336-845-3988   °Residential & Outpatient Substance Abuse Program  1-800-659-3381   °Psychological Services °Organization         Address  Phone  Notes  °[facility]  336- 832-9600   °[facility]  336- 378-7881   °  [facility] [address], [place] 1-800-853-5163 or 336-641-4981   ° °Mobile Crisis Teams °Organization         Address  Phone  Notes  °[facility], Mobile Crisis Care Unit  1-877-626-1772   °Assertive °[facility] ° [address] [place], [place] 336-834-9664   °Sharon DeEsch [address] °[place] 336-554-5454   ° °Self-Help/Support Groups °Organization         Address  Phone             Notes  °[facility] - variety of support groups  336- 373-1402 Call for more information  °Narcotics Anonymous (NA), [facility] [address], °[place]  2 meetings at this location  ° °Residential Treatment Programs °Organization         Address  Phone  Notes  °[facility] [address],    °[place]  1-866-801-8205   °[facility] ° [address], [place], [place] 704-293-8524   °[facility] [address], [place] 336-845-3988 Admissions: 8am-3pm M-F  °[facility] [address]    °[place], [place] 336-841-1104   °[facility] [address], [place], [place] 336-379-7146   °[facility] [address]  °[place], [place] 336-285-9073   °[facility] - Intensive Outpatient [address], [place], [place] 336-852-3033   °[facility] [address]  °[place], [place]  1-877-615-2722 or 336-784-9470   °[facility] [address] [place], [place] 336-227-7417 Accepts Medicaid  °[facility] [address]  ° [place] 1-800-659-3381 Substance Abuse/Addiction Treatment  ° °[place] Behavioral Health Resources °Organization         Address  Phone  Notes  °[facility]  (888) 581-9988   °Julie Brannon, PhD [address] [place], [place]   (336) 349-5553 or (336) 951-0000   °[facility]   [address] °[place], [place] (336) 349-4454   °[facility] [address], [place], [place] (336) 342-8316 Insurance/Medicaid/sponsorship through [facility]  °[facility] [address]                                    [place], [place] (336) 342-8316 Therapy/tele-psych/case  °[facility] [address]  ° [place], [place] (336) 349-2233    °Dr. Arfeen  (336) 349-4544   °[facility] [place]  [employer] [place] [facility] 1) [address],  °2) [address], [place] °3)  [address], [place] (336) 349-3220 °(336) 342-7768 ° °(336) 342-8140   °[place] [facility] (336) 342-1394 or (336) 342-3537 (After Hours)    ° ° ° °

## 2014-07-08 NOTE — ED Notes (Signed)
Pt here for STD check. sts he has been with someone that was positive for chlamydia. sts discharge from penis

## 2014-07-08 NOTE — ED Provider Notes (Signed)
CSN: [id number]     Arrival date & time 07/08/14  1119 History  This chart was scribed for Wynetta EmeryNicole Tylyn Derwin, PA-C, working with Benjiman CoreNathan Pickering, MD by Elon SpannerGarrett Cook, ED Scribe. This patient was seen in room TR09C/TR09C and the patient's care was started at 11:47 AM.   Chief Complaint  Patient presents with  . Exposure to STD   The history is provided by the patient. No language interpreter was used.   HPI Comments: Zachary Robinson is a 24 y.o. male who presents to the Emergency Department complaining of white/milky penile discharge onset 2 days ago.  He reports unprotected sex 1 week ago with a partner who was recently diagnosed and treated for chlamydia.  NKA.  Past Medical History  Diagnosis Date  . Asthma    History reviewed. No pertinent past surgical history. History reviewed. No pertinent family history. History  Substance Use Topics  . Smoking status: Current Every Day Smoker  . Smokeless tobacco: Not on file  . Alcohol Use: No    Review of Systems  Genitourinary: Positive for discharge.      Allergies  Review of patient's allergies indicates no known allergies.  Home Medications   Prior to Admission medications   Medication Sig Start Date End Date Taking? Authorizing Provider  ibuprofen (ADVIL,MOTRIN) 600 MG tablet Take 1 tablet (600 mg total) by mouth every 6 (six) hours as needed. 04/05/13   Renne CriglerJoshua Geiple, PA-C  traMADol (ULTRAM) 50 MG tablet Take 1 tablet (50 mg total) by mouth every 6 (six) hours as needed. 04/05/13   Renne CriglerJoshua Geiple, PA-C   BP 113/61 mmHg  Pulse 72  Temp(Src) 98.3 F (36.8 C) (Oral)  Ht 5\' 10"  (1.778 m)  Wt 165 lb (74.844 kg)  BMI 23.68 kg/m2  SpO2 97% Physical Exam  Constitutional: He is oriented to person, place, and time. He appears well-developed and well-nourished. No distress.  HENT:  Head: Normocephalic and atraumatic.  Eyes: Conjunctivae and EOM are normal.  Neck: Neck supple. No tracheal deviation present.  Cardiovascular:  Normal rate.   Pulmonary/Chest: Effort normal. No respiratory distress.  Genitourinary: Prostate normal and penis normal.  Testicular pain or swelling, patient is uncircumcised, scant urethral discharge.  Musculoskeletal: Normal range of motion.  Neurological: He is alert and oriented to person, place, and time.  Skin: Skin is warm and dry.  Psychiatric: He has a normal mood and affect. His behavior is normal.  Nursing note and vitals reviewed.   ED Course  Procedures (including critical care time)  DIAGNOSTIC STUDIES: Oxygen Saturation is 97% on RA, normal by my interpretation.    COORDINATION OF CARE:  11:50 AM Discussed treatment plan with patient at bedside.  Patient acknowledges and agrees with plan.    Labs Review Labs Reviewed - No data to display  Imaging Review No results found.   EKG Interpretation None      MDM   Final diagnoses:  Exposure to STD  Urethritis    Filed Vitals:   07/08/14 1124  BP: 113/61  Pulse: 72  Temp: 98.3 F (36.8 C)  TempSrc: Oral  Height: 5\' 10"  (1.778 m)  Weight: 165 lb (74.844 kg)  SpO2: 97%    Medications  azithromycin (ZITHROMAX) tablet 1,000 mg (1,000 mg Oral Given 07/08/14 1204)  cefTRIAXone (ROCEPHIN) injection 250 mg (250 mg Intramuscular Given 07/08/14 1205)  lidocaine (PF) (XYLOCAINE) 1 % injection 5 mL (5 mLs Other Given 07/08/14 1200)    Zachary Robinson is a pleasant 23  y.o. male presenting with urethritis after partner was diagnosed with chlamydia. I discussed findings and offered treatment for GC/Clamydia today in the ED. I explained to Pt that results of GC/Chlamidia testing are pending and that they may come back negative. Discussed pros and cons of treatment. Pt opted for treatment today. Pt will be given  of rocephin IM and 1g Azithromycin PO.    Evaluation does not show pathology that would require ongoing emergent intervention or inpatient treatment. Pt is hemodynamically stable and mentating  appropriately. Discussed findings and plan with patient/guardian, who agrees with care plan. All questions answered. Return precautions discussed and outpatient follow up given.    I personally performed the services described in this documentation, which was scribed in my presence. The recorded information has been reviewed and is accurate.    Wynetta Emery, PA-C 07/08/14 1626  Benjiman Core, MD 07/08/14 [phone]

## 2014-07-09 LAB — RPR: RPR: NONREACTIVE

## 2014-07-09 LAB — HIV ANTIBODY (ROUTINE TESTING W REFLEX): HIV Screen 4th Generation wRfx: NONREACTIVE

## 2014-07-10 LAB — GC/CHLAMYDIA PROBE AMP (~~LOC~~) NOT AT ARMC
Chlamydia: NEGATIVE
Neisseria Gonorrhea: NEGATIVE

## 2014-09-21 ENCOUNTER — Encounter (HOSPITAL_COMMUNITY): Payer: Self-pay | Admitting: Emergency Medicine

## 2014-09-21 ENCOUNTER — Emergency Department (HOSPITAL_COMMUNITY)
Admission: EM | Admit: 2014-09-21 | Discharge: 2014-09-22 | Disposition: A | Discharge status: Home / Self Care | Payer: Self-pay | Attending: Emergency Medicine | Admitting: Emergency Medicine

## 2014-09-21 DIAGNOSIS — Z8619 Personal history of other infectious and parasitic diseases: Secondary | ICD-10-CM | POA: Insufficient documentation

## 2014-09-21 DIAGNOSIS — J45909 Unspecified asthma, uncomplicated: Secondary | ICD-10-CM | POA: Insufficient documentation

## 2014-09-21 DIAGNOSIS — R369 Urethral discharge, unspecified: Secondary | ICD-10-CM

## 2014-09-21 DIAGNOSIS — R360 Urethral discharge without blood: Secondary | ICD-10-CM | POA: Insufficient documentation

## 2014-09-21 DIAGNOSIS — Z720 Tobacco use: Secondary | ICD-10-CM | POA: Insufficient documentation

## 2014-09-21 NOTE — ED Notes (Signed)
Pt. reports penile discharge onset this morning , denies dysuria or fever .

## 2014-09-22 LAB — HIV ANTIBODY (ROUTINE TESTING W REFLEX): HIV SCREEN 4TH GENERATION: NONREACTIVE

## 2014-09-22 LAB — URINALYSIS, ROUTINE W REFLEX MICROSCOPIC
BILIRUBIN URINE: NEGATIVE
GLUCOSE, UA: NEGATIVE mg/dL
Hgb urine dipstick: NEGATIVE
KETONES UR: NEGATIVE mg/dL
NITRITE: NEGATIVE
Protein, ur: NEGATIVE mg/dL
Specific Gravity, Urine: 1.019 (ref 1.005–1.030)
UROBILINOGEN UA: 0.2 mg/dL (ref 0.0–1.0)
pH: 5.5 (ref 5.0–8.0)

## 2014-09-22 LAB — URINE MICROSCOPIC-ADD ON

## 2014-09-22 LAB — RPR: RPR: NONREACTIVE

## 2014-09-22 MED ORDER — LIDOCAINE HCL (PF) 1 % IJ SOLN
INTRAMUSCULAR | Status: AC
  Administered 2014-09-22: 5 mL
  Filled 2014-09-22: qty 5

## 2014-09-22 MED ORDER — CEFTRIAXONE SODIUM 250 MG IJ SOLR
250.0000 mg | Freq: Once | INTRAMUSCULAR | Status: AC
  Administered 2014-09-22: 250 mg via INTRAMUSCULAR
  Filled 2014-09-22: qty 250

## 2014-09-22 MED ORDER — AZITHROMYCIN 250 MG PO TABS
1000.0000 mg | ORAL_TABLET | Freq: Once | ORAL | Status: AC
  Administered 2014-09-22: 1000 mg via ORAL
  Filled 2014-09-22: qty 4

## 2014-09-22 NOTE — ED Notes (Signed)
Pt stable, ambulatory, states understanding of discharge instructions 

## 2014-09-22 NOTE — ED Provider Notes (Signed)
CSN: [id number]     Arrival date & time 09/21/14  2153 History   First MD Initiated Contact with Patient 09/22/14 0005     Chief Complaint  Patient presents with  . Penile Discharge     (Consider location/radiation/quality/duration/timing/severity/associated sxs/prior Treatment) The history is provided by the patient. No language interpreter was used.  Domingo Dimeslbari J Treu is a 24 year old male with past medical history of asthma presenting to the ED with penile discharge that started this morning. Patient reports that at first it was a clear consistency but has now turned into a thick yellow consistency. Patient reports that the discharge occurs intermittently and states that he can feel the discharge. Stated that he is sexually active and states that he does not use protection. Patient reported that he was diagnosed with chlamydia when he was 24 years of age-reported the symptoms are very similar. Patient reported that he would like to be tested for HSV, syphilis, HIV. Denied dysuria, abdominal pain, nausea, vomiting, diarrhea, melena, hematochezia, swelling of the testicles, testicular pain, penile swelling, penile ulcerations or sores or lesions, fever, chills, back pain, neck pain, neck stiffness, sore throat, difficulty swallowing. PCP none   Past Medical History  Diagnosis Date  . Asthma    History reviewed. No pertinent past surgical history. No family history on file. History  Substance Use Topics  . Smoking status: Current Every Day Smoker  . Smokeless tobacco: Not on file  . Alcohol Use: No    Review of Systems  Constitutional: Negative for fever and chills.  HENT: Negative for sore throat and trouble swallowing.   Respiratory: Negative for shortness of breath.   Cardiovascular: Negative for chest pain.  Gastrointestinal: Negative for nausea, vomiting, abdominal pain and diarrhea.  Genitourinary: Positive for discharge. Negative for dysuria, hematuria, decreased urine  volume, penile swelling, scrotal swelling, penile pain and testicular pain.      Allergies  Review of patient's allergies indicates no known allergies.  Home Medications   Prior to Admission medications   Medication Sig Start Date End Date Taking? Authorizing Provider  ibuprofen (ADVIL,MOTRIN) 600 MG tablet Take 1 tablet (600 mg total) by mouth every 6 (six) hours as needed. 04/05/13   Renne CriglerJoshua Geiple, PA-C  traMADol (ULTRAM) 50 MG tablet Take 1 tablet (50 mg total) by mouth every 6 (six) hours as needed. 04/05/13   Renne CriglerJoshua Geiple, PA-C   BP 103/77 mmHg  Pulse 68  Temp(Src) 97.4 F (36.3 C) (Oral)  Resp 16  Ht 5\' 11"  (1.803 m)  Wt 158 lb (71.668 kg)  BMI 22.05 kg/m2  SpO2 100% Physical Exam  Constitutional: He is oriented to person, place, and time. He appears well-developed and well-nourished. No distress.  HENT:  Head: Normocephalic and atraumatic.  Mouth/Throat: Oropharynx is clear and moist. No oropharyngeal exudate.  Eyes: Conjunctivae and EOM are normal. Pupils are equal, round, and reactive to light. Right eye exhibits no discharge. Left eye exhibits no discharge.  Neck: Normal range of motion. Neck supple. No tracheal deviation present.  Cardiovascular: Normal rate, regular rhythm and normal heart sounds.  Exam reveals no friction rub.   No murmur heard. Pulmonary/Chest: Effort normal and breath sounds normal. No respiratory distress. He has no wheezes. He has no rales.  Abdominal: Soft. Bowel sounds are normal. He exhibits no distension. There is no tenderness. There is no rebound and no guarding.  Genitourinary: Testes normal. Circumcised. Discharge found.  Pelvic exam: Negative swelling, erythema, inflammation, lesions, sores, ulcerations, chancres noted to the  penis. Negative testicular swelling, negative pain upon palpation to the testicles. Negative inguinal lymphadenopathy bilaterally. Thick yellow discharge identified at the urethra. Negative blood noted. Exam  chaperoned with tech, Archie Patten.  Musculoskeletal: Normal range of motion.  Lymphadenopathy:    He has no cervical adenopathy.  Neurological: He is alert and oriented to person, place, and time.  Skin: Skin is warm and dry. No rash noted. He is not diaphoretic. No erythema.  Psychiatric: He has a normal mood and affect. His behavior is normal. Thought content normal.  Nursing note and vitals reviewed.   ED Course  Procedures (including critical care time)  Results for orders placed or performed during the hospital encounter of 09/21/14  Urinalysis, Routine w reflex microscopic (not at [facility])  Result Value Ref Range   Color, Urine YELLOW YELLOW   APPearance CLOUDY (A) CLEAR   Specific Gravity, Urine 1.019 1.005 - 1.030   pH 5.5 5.0 - 8.0   Glucose, UA NEGATIVE NEGATIVE mg/dL   Hgb urine dipstick NEGATIVE NEGATIVE   Bilirubin Urine NEGATIVE NEGATIVE   Ketones, ur NEGATIVE NEGATIVE mg/dL   Protein, ur NEGATIVE NEGATIVE mg/dL   Urobilinogen, UA 0.2 0.0 - 1.0 mg/dL   Nitrite NEGATIVE NEGATIVE   Leukocytes, UA MODERATE (A) NEGATIVE  Urine microscopic-add on  Result Value Ref Range   Squamous Epithelial / LPF RARE RARE   WBC, UA 7-10 <3 WBC/hpf   RBC / HPF 0-2 <3 RBC/hpf   Bacteria, UA FEW (A) RARE    Labs Review Labs Reviewed  URINALYSIS, ROUTINE W REFLEX MICROSCOPIC (NOT AT [facility]) - Abnormal; Notable for the following:    APPearance CLOUDY (*)    Leukocytes, UA MODERATE (*)    All other components within normal limits  URINE MICROSCOPIC-ADD ON - Abnormal; Notable for the following:    Bacteria, UA FEW (*)    All other components within normal limits  URINE CULTURE  HIV ANTIBODY (ROUTINE TESTING)  RPR  HSV 1 ANTIBODY, IGG  HSV 2 ANTIBODY, IGG  GC/CHLAMYDIA PROBE AMP ([place]) NOT AT [facility]    Imaging Review No results found.   EKG Interpretation None      MDM   Final diagnoses:  Penile discharge    Medications  cefTRIAXone (ROCEPHIN) injection 250 mg (250 mg  Intramuscular Given 09/22/14 0206)  azithromycin (ZITHROMAX) tablet 1,000 mg (1,000 mg Oral Given 09/22/14 0205)  lidocaine (PF) (XYLOCAINE) 1 % injection (5 mLs  Given 09/22/14 0206)    Filed Vitals:   09/21/14 2206 09/22/14 0106 09/22/14 0222  BP: 102/63 105/58 103/77  Pulse: 78 79 68  Temp: 98.3 F (36.8 C) 97.6 F (36.4 C) 97.4 F (36.3 C)  TempSrc: Oral Oral Oral  Resp: Height:  (1.803 m)    Weight: 158 lb (71.668 kg)    SpO2: 98% 100% 100%    Patient presenting to the ED with penile discharge that started this morning. Patient reports that he is sexually active and does not use protection. States that he had chlamydial infection when he was 24 years of age and states that these symptoms are similar. Patient would like to be tested for HIV, herpes, syphilis. Urinalysis negative for hemoglobin, nitrites-moderate leukocytes identified with a white blood cell count of 7-10. Urine culture pending. Physical examination noted benign abdominal exam. Penile discharge of a thick yellow consistency identified with negative penile swelling, sores or lesions. Negative testicular pain or swelling noted. Negative inguinal lymphadenopathy noted bilaterally. Patient treated  prophylactically for STD. GC Chlamydia probe, HIV, herpes, syphilis testing pending. Discussed with patient to rest and stay hydrated. Discussed with patient to have partner/partners within the past 3-6 months be tested and treated. Educated patient on safe sex habits. Discussed with patient to avoid any sexual activity until labs are resulted and has been fully treated. [facility] and [facility] department. Patient stable, afebrile. Patient not septic appearing. Discharged patient. Discussed with patient to closely monitor symptoms and if symptoms are to worsen or change to report back to the ED - strict return instructions given.  Patient agreed to plan of care, understood, all  questions answered.    Raymon Mutton, PA-C 09/22/14 1610  Loren Racer, MD 09/22/14 [phone]

## 2014-09-22 NOTE — Discharge Instructions (Signed)
Please call your doctor for a followup appointment within 24-48 hours. When you talk to your doctor please let them know that you were seen in the emergency department and have them acquire all of your records so that they can discuss the findings with you and formulate a treatment plan to fully care for your new and ongoing problems. Please follow-up with health and wellness Please follow-up with [facility] health department Please avoid any sexual activity until labs have resulted and you have been treated Please always use protection to prevent transmission of STDs, HIV Please have partner(s) within the past 3-6 months tested and treated Please continue to monitor symptoms closely and if symptoms are to worsen or change (fever greater than 101, chills, sweating, nausea, vomiting, chest pain, shortness of breathe, difficulty breathing, weakness, numbness, tingling, worsening or changes to pain pattern, stomach pain, nausea, vomiting, swelling to the penis, penile pain, sores or lesions to the penis, burning with urination, blood in the urine, decreased urination, pelvic swelling, testicular swelling, testicular pain, changes to skin colored the scrotum) please report back to the Emergency Department immediately.   Sexually Transmitted Disease A sexually transmitted disease (STD) is a disease or infection that may be passed (transmitted) from person to person, usually during sexual activity. This may happen by way of saliva, semen, blood, vaginal mucus, or urine. Common STDs include:   Gonorrhea.   Chlamydia.   Syphilis.   HIV and AIDS.   Genital herpes.   Hepatitis B and C.   Trichomonas.   Human papillomavirus (HPV).   Pubic lice.   Scabies.  Mites.  Bacterial vaginosis. WHAT ARE CAUSES OF STDs? An STD may be caused by bacteria, a virus, or parasites. STDs are often transmitted during sexual activity if one person is infected. However, they may also be transmitted through  nonsexual means. STDs may be transmitted after:   Sexual intercourse with an infected person.   Sharing sex toys with an infected person.   Sharing needles with an infected person or using unclean piercing or tattoo needles.  Having intimate contact with the genitals, mouth, or rectal areas of an infected person.   Exposure to infected fluids during birth. WHAT ARE THE SIGNS AND SYMPTOMS OF STDs? Different STDs have different symptoms. Some people may not have any symptoms. If symptoms are present, they may include:   Painful or bloody urination.   Pain in the pelvis, abdomen, vagina, anus, throat, or eyes.   A skin rash, itching, or irritation.  Growths, ulcerations, blisters, or sores in the genital and anal areas.  Abnormal vaginal discharge with or without bad odor.   Penile discharge in men.   Fever.   Pain or bleeding during sexual intercourse.   Swollen glands in the groin area.   Yellow skin and eyes (jaundice). This is seen with hepatitis.   Swollen testicles.  Infertility.  Sores and blisters in the mouth. HOW ARE STDs DIAGNOSED? To make a diagnosis, your health care provider may:   Take a medical history.   Perform a physical exam.   Take a sample of any discharge to examine.  Swab the throat, cervix, opening to the penis, rectum, or vagina for testing.  Test a sample of your first morning urine.   Perform blood tests.   Perform a Pap test, if this applies.   Perform a colposcopy.   Perform a laparoscopy.  HOW ARE STDs TREATED? Treatment depends on the STD. Some STDs may be treated but not cured.  Chlamydia, gonorrhea, trichomonas, and syphilis can be cured with antibiotic medicine.   Genital herpes, hepatitis, and HIV can be treated, but not cured, with prescribed medicines. The medicines lessen symptoms.   Genital warts from HPV can be treated with medicine or by freezing, burning (electrocautery), or surgery. Warts  may come back.   HPV cannot be cured with medicine or surgery. However, abnormal areas may be removed from the cervix, vagina, or vulva.   If your diagnosis is confirmed, your recent sexual partners need treatment. This is true even if they are symptom-free or have a negative culture or evaluation. They should not have sex until their health care providers say it is okay. HOW CAN I REDUCE MY RISK OF GETTING AN STD? Take these steps to reduce your risk of getting an STD:  Use latex condoms, dental dams, and water-soluble lubricants during sexual activity. Do not use petroleum jelly or oils.  Avoid having multiple sex partners.  Do not have sex with someone who has other sex partners.  Do not have sex with anyone you do not know or who is at high risk for an STD.  Avoid risky sex practices that can break your skin.  Do not have sex if you have open sores on your mouth or skin.  Avoid drinking too much alcohol or taking illegal drugs. Alcohol and drugs can affect your judgment and put you in a vulnerable position.  Avoid engaging in oral and anal sex acts.  Get vaccinated for HPV and hepatitis. If you have not received these vaccines in the past, talk to your health care provider about whether one or both might be right for you.   If you are at risk of being infected with HIV, it is recommended that you take a prescription medicine daily to prevent HIV infection. This is called pre-exposure prophylaxis (PrEP). You are considered at risk if:  You are a man who has sex with other men (MSM).  You are a heterosexual man or woman and are sexually active with more than one partner.  You take drugs by injection.  You are sexually active with a partner who has HIV.  Talk with your health care provider about whether you are at high risk of being infected with HIV. If you choose to begin PrEP, you should first be tested for HIV. You should then be tested every 3 months for as long as you  are taking PrEP.  WHAT SHOULD I DO IF I THINK I HAVE AN STD?  See your health care provider.   Tell your sexual partner(s). They should be tested and treated for any STDs.  Do not have sex until your health care provider says it is okay. WHEN SHOULD I GET IMMEDIATE MEDICAL CARE? Contact your health care provider right away if:   You have severe abdominal pain.  You are a man and notice swelling or pain in your testicles.  You are a woman and notice swelling or pain in your vagina. Document Released: 07/05/2002 Document Revised: 04/19/2013 Document Reviewed: 11/02/2012 [facility] Patient Information 2015 [place], [place]. This information is not intended to replace advice given to you by your health care provider. Make sure you discuss any questions you have with your health care provider.   Emergency Department Resource Guide 1) Find a Doctor and Pay Out of Pocket Although you won't have to find out who is covered by your insurance plan, it is a good idea to ask around and get recommendations. You  will then need to call the office and see if the doctor you have chosen will accept you as a new patient and what types of options they offer for patients who are self-pay. Some doctors offer discounts or will set up payment plans for their patients who do not have insurance, but you will need to ask so you aren't surprised when you get to your appointment.  2) Contact Your Local Health Department Not all health departments have doctors that can see patients for sick visits, but many do, so it is worth a call to see if yours does. If you don't know where your local health department is, you can check in your phone book. The CDC also has a tool to help you locate your state's health department, and many state websites also have listings of all of their local health departments.  3) Find a Walk-in Clinic If your illness is not likely to be very severe or complicated, you may want to try a walk in  clinic. These are popping up all over the country in pharmacies, drugstores, and shopping centers. They're usually staffed by nurse practitioners or physician assistants that have been trained to treat common illnesses and complaints. They're usually fairly quick and inexpensive. However, if you have serious medical issues or chronic medical problems, these are probably not your best option.  No Primary Care Doctor: - Call Health Connect at  [phone] - they can help you locate a primary care doctor that  accepts your insurance, provides certain services, etc. - Physician Referral Service- [phone]  Chronic Pain Problems: Organization         Address  Phone   Notes  [facility]  [phone] Patients need to be referred by their primary care doctor.   Medication Assistance: Organization         Address  Phone   Notes  [facility] Medication [facility] [address] [place], [place] [address] [phone] --Must be a resident of [facility] -- Must have NO insurance coverage whatsoever (no Medicaid/ Medicare, etc.) -- The pt. MUST have a primary care doctor that directs their care regularly and follows them in the community   [facility]ist  [phone]   [employer]  [phone]    Agencies that provide inexpensive medical care: Organization         Address  Phone   Notes  [facility]  [phone]   [facility]    [phone]   [facility] [address] [place], [place] [address] [phone]   [facility] [address], [place] [phone]   Planned Parenthood    [phone]   [facility]    [phone]   [facility]  [address], [place] Phone:  [phone], Fax:  [phone] Hours of Operation:  9 am - 6 pm, M-F.  Also accepts Medicaid/Medicare and self-pay.  [facility]  [address], [place] Phone: [phone], Fax: [phone]. Hours of Operation:  8:30 am - 5:30 pm, M-F.  Also accepts Medicaid and self-pay.  [facility] [place] [address] [place] Phone: [phone]   [facility] [address] Natasha Bence [place], [place] [phone], Ext. [phone] Mondays & Thursdays: 7-9 AM.  First 15 patients are seen on a first come, first  serve basis.    Medicaid-accepting [facility] Providers:  Organization         Address  Phone   Notes  [facility] [address], [place] [phone] Also accepts self-pay patients.  [facility] [address] Laurell Josephs [place], [place]  [phone]   [facility] [address], [place] [phone]   [facility] [address], [place] [phone]   Renaye Rakers [address], [place]   [phone] Only accepts [place] [employer] [place] patients after they have their name applied to their card.   Self-Pay (no insurance) in [facility]:  Organization         Address  Phone   Notes  Sickle Cell Patients, [facility] [address] [place], [place] [phone]   [facility] [address] [place], [place] [phone]   [facility] [place]  [address], [place] [phone]   [facility]/Dr. Osei-Bonsu  [address] [place] or [address], [place] [phone] Phone number for both [place] and [place] locations is the same.  [facility] [address] [place] [phone]   [facility] [address], [place] or [address] [phone]   [facility] [address] [place] [phone], phone; [phone], fax Sees patients  1st and 3rd Saturday of every month.  Must not qualify for public or private insurance (i.e. Medicaid, Medicare, [place] Health Choice, Veterans' Benefits)  Household income should be no more than 200% of the poverty level The clinic cannot treat you if you are pregnant or think you are pregnant  Sexually transmitted diseases are not treated at the clinic.    Dental Care: Organization         Address  Phone  Notes  [facility] Department of [facility] [address] [place], [place] [phone] Accepts children up to age 10 who are enrolled in [place] or [place] Health Choice; pregnant women with a Medicaid card; and children who have applied for Medicaid or [place] Health Choice, but were declined, whose parents can pay a reduced fee at time of service.  [facility] Department of [facility]  [address], [place] [phone] Accepts children up to age 94 who are enrolled in [place] or  Health Choice; pregnant women with a Medicaid card; and children who have applied for Medicaid or  Health Choice, but were declined, whose parents can pay a reduced fee at time of service.  [facility]  [address] [place], [place] [phone] Patients are seen by appointment only. Walk-ins are not accepted. [facility] will see patients 61 years of age and older. Monday - Tuesday (8am-5pm) Most Wednesdays (8:30-5pm) $30 per visit, cash only  [facility] Adult [facility] Access [facility]  [address], [facility] [phone] Patients are seen by appointment only. Walk-ins are not accepted. [facility] will see patients 77 years of age and older. One Wednesday Evening (Monthly: Volunteer Based).  $30 per visit, cash only  [employer]  [phone] for adults; Children under age 83, call Graduate Pediatric Dentistry at [phone]. Children aged 24-14, please call [phone] to request a  pediatric application.  Dental services are provided in all areas of dental care including fillings, crowns and bridges, complete and partial dentures, implants, gum treatment, root canals, and extractions. Preventive care is also provided. Treatment is provided to both adults and children. Patients are selected via a lottery and there is often a waiting list.   [facility] [address], [place]  [phone] [email or web address]   [facility] [address], [place], [place] [phone], Ext. [phone] Second and Fourth Thursday of each month, opens at 6:30 AM; Clinic ends at 9 AM.  Patients are seen on a first-come first-served basis, and a limited number are seen during each clinic.   [facility]  [address], [place], [place] [phone]   Eligibility Requirements You must have lived in [place], [place], or [place] counties for at least the last three months.   You cannot be eligible for state or federal sponsored [place]healthcare insurance, including CIG[employer]eterans Administration, [place]icaid, or [employer]icare.   You generally cannot be eligible for healthcare insurance through your employer.    How to apply: Eligibility screenings are held every Tuesday and Wednesday afternoon from 1:00 pm until 4:00 pm. You do not need an appointment for the interview!  [facility] [address], [place], [place] [phone]   [facility]  [phone]   [facility]  [phone]   [facility]  [phone]    Behavioral Health Resources in the Community: Intensive Outpatient Programs Organization         Address  Phone  Notes  [facility] [address], [place], [place] [phone]   [facility] [address], [place], [place] [phone]   [facility] [address], [place], [place]  [phone]   [facility] [address],  [place], [place] [phone] or [phone]   Substance Abuse Resources Organization         Address  Phone  Notes  [facility]  [phone]   [facility]  [phone]   [facility]  [phone]   [facility]  [phone]   Residential & Outpatient Substance Abuse Program  [phone]   Psychological Services Organization         Address  Phone  Notes  [facility]  [phone]- [phone]   [facility]  [phone]- [phone]   [facility] [address], [place] [phone] or [phone]    Mobile Crisis Teams Organization         Address  Phone  Notes  [facility], Mobile Crisis Care Unit  [phone]   [facility]  [address] [place], [place] [phone]   Doristine LocksSharon DeEsch [address] [place] [phone]    Self-Help/Support Groups Organization         Address  Phone             Notes  Mental Health Assoc. of  - variety of support groups  [phone]- [phone] Call for more information  Narcotics Anonymous (NA), [facility] [address], [place]  2 meetings at this location   Statisticianesidential Treatment Programs Organization         Address  Phone  Notes  [facility] [address],    [place]  [phone]   [facility]  [id number], [place], [place] [phone]   [employer] [facility] [address] [place], [place] [phone] Admissions: 8am-3pm M-F  [facility] [address]    [place], [place] [phone]   [facility] [address] [place], [place], [place] [phone]   [facility] [address]  [place], [place] [phone]   [facility] - Intensive Outpatient [address] Laurell Josephs [address], [place], [place] [phone]   [facility] (Addiction Recovery [facility] [address] [place].,    [place], [place] [phone] or [phone]   [facility] ([facility]) [address], [place], [place] [phone] Accepts Medicaid  [facility] [place] [facility] [address]  [place] [phone] Substance Abuse/Addiction Treatment   [facility] Organization         Address  Phone  Notes  [facility]  [phone]   Angie Fava, PhD [address] Ervin Knack [place], [place]   [phone] or [phone]   [facility] Behavioral   [address] [place], [place] [phone]   [facility] [address], [place], [place] [phone] Insurance/Medicaid/sponsorship through [facility] and Families [address]                                    [place], [place] [phone] Therapy/tele-psych/case  [facility] [address], [place] [phone]    Dr. Lolly Mustache  [phone]   [facility]  [employer] [facility] Dept. 1) [address] [place] 2) [address] [place] 3)  [address], [place] [phone]  [phone]   [facility] Child Abuse Hotline [phone] or 6517525885 (After Hours)

## 2014-09-22 NOTE — ED Notes (Signed)
Gave pt water, per Ashok CordiaMarissa - PA

## 2014-09-23 LAB — URINE CULTURE
Colony Count: NO GROWTH
Culture: NO GROWTH
Special Requests: NORMAL

## 2014-09-23 LAB — HSV 2 ANTIBODY, IGG: HSV 2 GLYCOPROTEIN G AB, IGG: 1.32 {index} — AB (ref 0.00–0.90)

## 2014-09-23 LAB — HSV 1 ANTIBODY, IGG: HSV 1 Glycoprotein G Ab, IgG: 44.4 index — ABNORMAL HIGH (ref 0.00–0.90)

## 2015-04-12 ENCOUNTER — Emergency Department (HOSPITAL_COMMUNITY)
Admission: EM | Admit: 2015-04-12 | Discharge: 2015-04-12 | Disposition: A | Discharge status: Home / Self Care | Payer: Self-pay | Attending: Emergency Medicine | Admitting: Emergency Medicine

## 2015-04-12 ENCOUNTER — Encounter (HOSPITAL_COMMUNITY): Payer: Self-pay | Admitting: *Deleted

## 2015-04-12 DIAGNOSIS — F17200 Nicotine dependence, unspecified, uncomplicated: Secondary | ICD-10-CM | POA: Insufficient documentation

## 2015-04-12 DIAGNOSIS — J45909 Unspecified asthma, uncomplicated: Secondary | ICD-10-CM | POA: Insufficient documentation

## 2015-04-12 DIAGNOSIS — R369 Urethral discharge, unspecified: Secondary | ICD-10-CM | POA: Insufficient documentation

## 2015-04-12 LAB — RPR: RPR Ser Ql: NONREACTIVE

## 2015-04-12 LAB — HIV ANTIBODY (ROUTINE TESTING W REFLEX): HIV Screen 4th Generation wRfx: NONREACTIVE

## 2015-04-12 LAB — GC/CHLAMYDIA PROBE AMP (~~LOC~~) NOT AT ARMC
Chlamydia: NEGATIVE
NEISSERIA GONORRHEA: NEGATIVE

## 2015-04-12 MED ORDER — AZITHROMYCIN 250 MG PO TABS
1000.0000 mg | ORAL_TABLET | Freq: Once | ORAL | Status: AC
  Administered 2015-04-12: 1000 mg via ORAL
  Filled 2015-04-12: qty 4

## 2015-04-12 MED ORDER — CEFTRIAXONE SODIUM 250 MG IJ SOLR
250.0000 mg | Freq: Once | INTRAMUSCULAR | Status: AC
  Administered 2015-04-12: 250 mg via INTRAMUSCULAR
  Filled 2015-04-12: qty 250

## 2015-04-12 MED ORDER — ONDANSETRON 4 MG PO TBDP
4.0000 mg | ORAL_TABLET | Freq: Once | ORAL | Status: AC
  Administered 2015-04-12: 4 mg via ORAL
  Filled 2015-04-12: qty 1

## 2015-04-12 MED ORDER — STERILE WATER FOR INJECTION IJ SOLN
10.0000 mL | Freq: Once | INTRAMUSCULAR | Status: AC
  Administered 2015-04-12: 10 mL via INTRAMUSCULAR
  Filled 2015-04-12: qty 10

## 2015-04-12 NOTE — ED Notes (Signed)
PT reports he saw discharge from penis this AM.

## 2015-04-12 NOTE — ED Provider Notes (Signed)
CSN: [id number]     Arrival date & time 04/12/15  [id number] History   First MD Initiated Contact with Patient 04/12/15 [phone]     Chief Complaint  Patient presents with  . Penile Discharge     (Consider location/radiation/quality/duration/timing/severity/associated sxs/prior Treatment) HPI   Zachary Robinson is a(n) 24 y.o. male who presents to the ED with cc of penile discharge. The patient states that he had unprotected vaginal intercourse 6 days ago. This morning he awoke with clear discharge from his penis. He denies testicle pain, dysuria, hematuria or abdominal pain. He noticed some small bumps on the distal shaft of his penis this morning.  He has no pmh of STDs. Past Medical History  Diagnosis Date  . Asthma    History reviewed. No pertinent past surgical history. History reviewed. No pertinent family history. Social History  Substance Use Topics  . Smoking status: Current Every Day Smoker  . Smokeless tobacco: None  . Alcohol Use: No    Review of Systems  Ten systems reviewed and are negative for acute change, except as noted in the HPI.    Allergies  Review of patient's allergies indicates no known allergies.  Home Medications   Prior to Admission medications   Medication Sig Start Date End Date Taking? Authorizing Provider  ibuprofen (ADVIL,MOTRIN) 600 MG tablet Take 1 tablet (600 mg total) by mouth every 6 (six) hours as needed. 04/05/13  Yes Renne CriglerJoshua Geiple, PA-C  traMADol (ULTRAM) 50 MG tablet Take 1 tablet (50 mg total) by mouth every 6 (six) hours as needed. 04/05/13  Yes Joshua Geiple, PA-C   BP 115/61 mmHg  Pulse 77  Temp(Src) 98.1 F (36.7 C) (Oral)  Resp 20  Ht 5\' 11"  (1.803 m)  Wt 69.854 kg  BMI 21.49 kg/m2  SpO2 99% Physical Exam  Constitutional: He appears well-developed and well-nourished. No distress.  HENT:  Head: Normocephalic and atraumatic.  Eyes: Conjunctivae are normal. No scleral icterus.  Neck: Normal range of motion. Neck supple.    Cardiovascular: Normal rate, regular rhythm and normal heart sounds.   Pulmonary/Chest: Effort normal and breath sounds normal. No respiratory distress.  Abdominal: Soft. There is no tenderness.  Genitourinary: Testes normal and penis normal.    Cremasteric reflex is present. Right testis shows no tenderness. Left testis shows no tenderness. Circumcised. No discharge found.  Musculoskeletal: He exhibits no edema.  Neurological: He is alert.  Skin: Skin is warm and dry. He is not diaphoretic.  Psychiatric: His behavior is normal.  Nursing note and vitals reviewed.   ED Course  Procedures (including critical care time) Labs Review Labs Reviewed  HIV ANTIBODY (ROUTINE TESTING)  RPR  GC/CHLAMYDIA PROBE AMP ([place]    Imaging Review No results found. I have personally reviewed and evaluated these images and lab results as part of my medical decision-making.   EKG Interpretation None      MDM   Final diagnoses:  Penile discharge    Patient treated in the ED for STI with rocephin and azithromycin. Patient advised to inform and treat all sexual partners.  Pt advised on safe sex practices and understands that they have GC/Chlamydia cultures pending and will result in 2-3 days. HIV and RPR sent. Pt encouraged to follow up at local health department for future STI checks. No concern for prostatitis or epididymitis. Discussed return precautions. Pt appears safe for discharge.     Arthor Captainbigail Dandre Sisler, PA-C 04/12/15 1625  Rolland PorterMark James, MD 04/15/15 [phone]

## 2015-04-12 NOTE — ED Notes (Signed)
See PA assessment 

## 2015-04-12 NOTE — Discharge Instructions (Signed)
You have been treated today for Gonhorrhea and chlamydia. Your HIV/ Syphillis tests are pending.  You may login to My chart to look at your results and you will be contacted if there is an abnormality.  Sexually Transmitted Disease A sexually transmitted disease (STD) is a disease or infection that may be passed (transmitted) from person to person, usually during sexual activity. This may happen by way of saliva, semen, blood, vaginal mucus, or urine. Common STDs include:  Gonorrhea.  Chlamydia.  Syphilis.  HIV and AIDS.  Genital herpes.  Hepatitis B and C.  Trichomonas.  Human papillomavirus (HPV).  Pubic lice.  Scabies.  Mites.  Bacterial vaginosis. WHAT ARE CAUSES OF STDs? An STD may be caused by bacteria, a virus, or parasites. STDs are often transmitted during sexual activity if one person is infected. However, they may also be transmitted through nonsexual means. STDs may be transmitted after:   Sexual intercourse with an infected person.  Sharing sex toys with an infected person.  Sharing needles with an infected person or using unclean piercing or tattoo needles.  Having intimate contact with the genitals, mouth, or rectal areas of an infected person.  Exposure to infected fluids during birth. WHAT ARE THE SIGNS AND SYMPTOMS OF STDs? Different STDs have different symptoms. Some people may not have any symptoms. If symptoms are present, they may include:  Painful or bloody urination.  Pain in the pelvis, abdomen, vagina, anus, throat, or eyes.  A skin rash, itching, or irritation.  Growths, ulcerations, blisters, or sores in the genital and anal areas.  Abnormal vaginal discharge with or without bad odor.  Penile discharge in men.  Fever.  Pain or bleeding during sexual intercourse.  Swollen glands in the groin area.  Yellow skin and eyes (jaundice). This is seen with hepatitis.  Swollen testicles.  Infertility.  Sores and blisters in the  mouth. HOW ARE STDs DIAGNOSED? To make a diagnosis, your health care provider may:  Take a medical history.  Perform a physical exam.  Take a sample of any discharge to examine.  Swab the throat, cervix, opening to the penis, rectum, or vagina for testing.  Test a sample of your first morning urine.  Perform blood tests.  Perform a Pap test, if this applies.  Perform a colposcopy.  Perform a laparoscopy. HOW ARE STDs TREATED? Treatment depends on the STD. Some STDs may be treated but not cured.  Chlamydia, gonorrhea, trichomonas, and syphilis can be cured with antibiotic medicine.  Genital herpes, hepatitis, and HIV can be treated, but not cured, with prescribed medicines. The medicines lessen symptoms.  Genital warts from HPV can be treated with medicine or by freezing, burning (electrocautery), or surgery. Warts may come back.  HPV cannot be cured with medicine or surgery. However, abnormal areas may be removed from the cervix, vagina, or vulva.  If your diagnosis is confirmed, your recent sexual partners need treatment. This is true even if they are symptom-free or have a negative culture or evaluation. They should not have sex until their health care providers say it is okay.  Your health care provider may test you for infection again 3 months after treatment. HOW CAN I REDUCE MY RISK OF GETTING AN STD? Take these steps to reduce your risk of getting an STD:  Use latex condoms, dental dams, and water-soluble lubricants during sexual activity. Do not use petroleum jelly or oils.  Avoid having multiple sex partners.  Do not have sex with someone who has  other sex partners  Do not have sex with anyone you do not know or who is at high risk for an STD.  Avoid risky sex practices that can break your skin.  Do not have sex if you have open sores on your mouth or skin.  Avoid drinking too much alcohol or taking illegal drugs. Alcohol and drugs can affect your judgment  and put you in a vulnerable position.  Avoid engaging in oral and anal sex acts.  Get vaccinated for HPV and hepatitis. If you have not received these vaccines in the past, talk to your health care provider about whether one or both might be right for you.  If you are at risk of being infected with HIV, it is recommended that you take a prescription medicine daily to prevent HIV infection. This is called pre-exposure prophylaxis (PrEP). You are considered at risk if:  You are a man who has sex with other men (MSM).  You are a heterosexual man or woman and are sexually active with more than one partner.  You take drugs by injection.  You are sexually active with a partner who has HIV.  Talk with your health care provider about whether you are at high risk of being infected with HIV. If you choose to begin PrEP, you should first be tested for HIV. You should then be tested every 3 months for as long as you are taking PrEP. WHAT SHOULD I DO IF I THINK I HAVE AN STD?  See your health care provider.  Tell your sexual partner(s). They should be tested and treated for any STDs.  Do not have sex until your health care provider says it is okay. WHEN SHOULD I GET IMMEDIATE MEDICAL CARE? Contact your health care provider right away if:   You have severe abdominal pain.  You are a man and notice swelling or pain in your testicles.  You are a woman and notice swelling or pain in your vagina.   This information is not intended to replace advice given to you by your health care provider. Make sure you discuss any questions you have with your health care provider.   Document Released: 07/05/2002 Document Revised: 05/05/2014 Document Reviewed: 11/02/2012 Elsevier Interactive Patient Education 2016 Elsevier Inc[employer]  Free HIV and STD Testing These locations offer FREE confidential testing for HIV, Chlamydia, Gonorrhea, and Syphilis. Non-Traditional Testing Sites Address Telephone  [facility] [address] [place] [phone] Mondays 5pm - 7pm  [facility] [address] [place] [phone] Wednesdays 2pm-8pm  [employer] [facility] [address] [place] [phone] Thursdays 9am-12noon 1pm-4pm  [facility] [address] [place] [phone] Tuesdays Thursdays 9am-12noon 1pm-4pm  [facility] of [employer] offers free, confidential testing and treatment for HIV, Chlamydia, Gonorrhea, Syphilis, Herpes, Bacterial Vaginosis, Yeast, and Trichomoniasis. Traditional Testing   [facility] [facility] [address] [place] [phone]  Monday thru Friday  Call for an appointment  [facility] Department- [facility] [address] [place] [phone] Monday thru Friday  Call for anappointment.  If you have any questions about this information please call [phone]. 03/06/2011

## 2015-10-16 ENCOUNTER — Ambulatory Visit (HOSPITAL_COMMUNITY)
Admission: EM | Admit: 2015-10-16 | Discharge: 2015-10-16 | Disposition: A | Discharge status: Home / Self Care | Payer: Self-pay | Attending: Emergency Medicine | Admitting: Emergency Medicine

## 2015-10-16 ENCOUNTER — Encounter (HOSPITAL_COMMUNITY): Payer: Self-pay | Admitting: Emergency Medicine

## 2015-10-16 DIAGNOSIS — K5900 Constipation, unspecified: Secondary | ICD-10-CM

## 2015-10-16 DIAGNOSIS — R109 Unspecified abdominal pain: Secondary | ICD-10-CM

## 2015-10-16 DIAGNOSIS — R112 Nausea with vomiting, unspecified: Secondary | ICD-10-CM

## 2015-10-16 LAB — OCCULT BLOOD, POC DEVICE: Fecal Occult Bld: NEGATIVE

## 2015-10-16 MED ORDER — CIPROFLOXACIN HCL 500 MG PO TABS: 500.0000 mg | ORAL_TABLET | Freq: Two times a day (BID) | ORAL | Status: DC

## 2015-10-16 MED ORDER — POLYETHYLENE GLYCOL 3350 17 G PO PACK: 17.0000 g | PACK | Freq: Every day | ORAL | Status: DC

## 2015-10-16 MED ORDER — ONDANSETRON HCL 4 MG PO TABS: 4.0000 mg | ORAL_TABLET | Freq: Four times a day (QID) | ORAL | Status: DC

## 2015-10-16 MED ORDER — METRONIDAZOLE 500 MG PO TABS: 500.0000 mg | ORAL_TABLET | Freq: Two times a day (BID) | ORAL | Status: DC

## 2015-10-16 NOTE — Discharge Instructions (Signed)
Please be sure to take medication as prescribed.  If symptoms not improving in 3-4 days, please seek medical attention for recheck of symptoms.  Pleas seek medical attention in Urgent Care or Emergency Department soon if symptoms worsening- increased pain, unable to keep down fluids, or other new concerning symptoms develop.

## 2015-10-16 NOTE — ED Notes (Signed)
[facility] a complaint of passing blood in his stool that started today. The patient stated that he has had blood in his stool x 4. The patient stated that he was initially constipated and took Pepto Bismol and was able to have a loose stool.

## 2015-10-16 NOTE — ED Provider Notes (Signed)
CSN: [id number]     Arrival date & time 10/16/15  1336 History   First MD Initiated Contact with Patient 10/16/15 1423     Chief Complaint  Patient presents with  . Hematochezia  . Constipation   (Consider location/radiation/quality/duration/timing/severity/associated sxs/prior Treatment) HPI Zachary Robinson is a 25 y.o. male presenting to UC with c/o Left lower abdominal pain, constipation, and blood in stool 4 times since this morning. Abdominal pain is cramping and sharp at times, intermittent, 8/10 at worst.  Pt notes he woke this morning with abdominal pain and feeling constipated so his mother gave him 2 teaspoons of Pepto Bismol, he has been able to have loose stools since then but concerned as he has also been nauseated and vomiting 2-3 times since onset of symptoms. He has noticed a small amount of blood in his stool after straining.  He notes he went to a cookout last night but does not know of anyone else being sick.  Denies fever or chills. No hx of abdominal surgeries. No urinary symptoms.    Past Medical History  Diagnosis Date  . Asthma    History reviewed. No pertinent past surgical history. History reviewed. No pertinent family history. Social History  Substance Use Topics  . Smoking status: Current Every Day Smoker  . Smokeless tobacco: None  . Alcohol Use: No    Review of Systems  Constitutional: Negative for fever and chills.  HENT: Negative for congestion, ear pain, sore throat, trouble swallowing and voice change.   Respiratory: Negative for cough and shortness of breath.   Cardiovascular: Negative for chest pain and palpitations.  Gastrointestinal: Positive for nausea, vomiting, abdominal pain, diarrhea and blood in stool.  Genitourinary: Negative for dysuria, frequency, hematuria and flank pain.  Musculoskeletal: Negative for myalgias, back pain and arthralgias.  Skin: Negative for rash.    Allergies  Review of patient's allergies indicates no known  allergies.  Home Medications   Prior to Admission medications   Medication Sig Start Date End Date Taking? Authorizing Provider  ciprofloxacin (CIPRO) 500 MG tablet Take 1 tablet (500 mg total) by mouth 2 (two) times daily. One po bid x 7 days 10/16/15   Zachary Finner, PA-C  ibuprofen (ADVIL,MOTRIN) 600 MG tablet Take 1 tablet (600 mg total) by mouth every 6 (six) hours as needed. 04/05/13   Zachary Crigler, PA-C  metroNIDAZOLE (FLAGYL) 500 MG tablet Take 1 tablet (500 mg total) by mouth 2 (two) times daily. One po bid x 7 days 10/16/15   Zachary Finner, PA-C  ondansetron (ZOFRAN) 4 MG tablet Take 1 tablet (4 mg total) by mouth every 6 (six) hours. As needed for nausea 10/16/15   Zachary Finner, PA-C  polyethylene glycol (MIRALAX / GLYCOLAX) packet Take 17 g by mouth daily. 10/16/15   Zachary Finner, PA-C  traMADol (ULTRAM) 50 MG tablet Take 1 tablet (50 mg total) by mouth every 6 (six) hours as needed. 04/05/13   Zachary Crigler, PA-C   Meds Ordered and Administered this Visit  Medications - No data to display  BP 112/58 mmHg  Pulse 62  Temp(Src) 97.8 F (36.6 C) (Oral)  Resp 18  SpO2 100% No data found.   Physical Exam  Constitutional: He appears well-developed and well-nourished. No distress.  HENT:  Head: Normocephalic and atraumatic.  Mouth/Throat: Oropharynx is clear and moist.  Eyes: Conjunctivae are normal. No scleral icterus.  Neck: Normal range of motion.  Cardiovascular: Normal rate, regular rhythm and normal heart sounds.   Pulmonary/Chest:  Effort normal and breath sounds normal. No respiratory distress. He has no wheezes. He has no rales.  Abdominal: Soft. Bowel sounds are normal. He exhibits no distension and no mass. There is tenderness. There is no rebound and no guarding.  Soft, non-distended. Mild tenderness to Left side and Left lower abdomen. No rebound or guarding.  Musculoskeletal: Normal range of motion.  Neurological: He is alert.  Skin: Skin is warm and dry. He is  not diaphoretic.  Nursing note and vitals reviewed.   ED Course  Procedures (including critical care time)  Labs Review Labs Reviewed  OCCULT BLOOD, POC DEVICE    Imaging Review No results found.    MDM   1. Left sided abdominal pain   2. Non-intractable vomiting with nausea, vomiting of unspecified type   3. Constipation, unspecified constipation type    Pt c/o Left sided abdominal pain, n/v/d since this morning with sensation of feeling constipated.    Hemoccult- negative  No hx of abdominal surgeries. Abdominal exam not concerning for surgical abdomen at this time. Symptoms possibly due to colitis given area of tenderness and symptoms. Will treat empirically with cipro and flagyl  Rx: cipro, flagyl, and zofran.  Encouraged fluids and rest. F/u with PCP in 3-4 days, UC if needed. Sooner if worsening symptoms- increased pain, unable to keep down fluids, or other new concerning symptoms develop. Patient verbalized understanding and agreement with treatment plan.     Zachary Finnerrin O'Malley, PA-C 10/16/15 1545

## 2016-01-19 ENCOUNTER — Emergency Department (HOSPITAL_COMMUNITY)
Admission: EM | Admit: 2016-01-19 | Discharge: 2016-01-19 | Disposition: A | Discharge status: Home / Self Care | Payer: Self-pay | Attending: Emergency Medicine | Admitting: Emergency Medicine

## 2016-01-19 ENCOUNTER — Encounter (HOSPITAL_COMMUNITY): Payer: Self-pay

## 2016-01-19 DIAGNOSIS — J45909 Unspecified asthma, uncomplicated: Secondary | ICD-10-CM | POA: Insufficient documentation

## 2016-01-19 DIAGNOSIS — J029 Acute pharyngitis, unspecified: Secondary | ICD-10-CM | POA: Insufficient documentation

## 2016-01-19 DIAGNOSIS — F17200 Nicotine dependence, unspecified, uncomplicated: Secondary | ICD-10-CM | POA: Insufficient documentation

## 2016-01-19 LAB — RAPID STREP SCREEN (MED CTR MEBANE ONLY): STREPTOCOCCUS, GROUP A SCREEN (DIRECT): NEGATIVE

## 2016-01-19 NOTE — Discharge Instructions (Signed)
Your rapid strep screen is negative. We have sent a culture of your swab. If this returns positive we will call you. Meanwhile, salt water gargles every 1-2 hrs. Take tylenol or motrin for pain. Follow up as needed.

## 2016-01-19 NOTE — ED Triage Notes (Signed)
Patient complains of sore throat x 1 day. States that his tonsils look swollen, no acute distress.

## 2016-01-19 NOTE — ED Notes (Signed)
Declined W/C at D/C and was escorted to lobby by RN. 

## 2016-01-19 NOTE — ED Provider Notes (Signed)
[facility]-EMERGENCY DEPT Provider Note   CSN: [id number] Arrival date & time: 01/19/16  1212  By signing my name below, I, Zachary Robinson, attest that this documentation has been prepared under the direction and in the presence of non-physician practitioner, Jaynie Crumble, PA-C. Electronically Signed: Majel Robinson, Scribe. 01/19/2016. 12:32 PM.  History   Chief Complaint Chief Complaint  Patient presents with  . Sore Throat   The history is provided by the patient. No language interpreter was used.   HPI Comments: Zachary Robinson is a 25 y.o. male with PMHx of asthma, who presents to the Emergency Department complaining of gradually worsening, sore throat that began yesterday morning and worsened today. Pt reports associated subjective fever, cough and 1 episode of vomiting yesterday; he states he is currently nauseous now in the ED. He notes hx of strep throat 2 years ago and states his current symptoms feel similar. He states he has tried drinking hot tea and used throat lozenges with no relief. Pt denies rhinorrhea.   Past Medical History:  Diagnosis Date  . Asthma    There are no active problems to display for this patient.  History reviewed. No pertinent surgical history.  Home Medications    Prior to Admission medications   Medication Sig Start Date End Date Taking? Authorizing Provider  ciprofloxacin (CIPRO) 500 MG tablet Take 1 tablet (500 mg total) by mouth 2 (two) times daily. One po bid x 7 days 10/16/15   Junius Finner, PA-C  ibuprofen (ADVIL,MOTRIN) 600 MG tablet Take 1 tablet (600 mg total) by mouth every 6 (six) hours as needed. 04/05/13   Renne Crigler, PA-C  metroNIDAZOLE (FLAGYL) 500 MG tablet Take 1 tablet (500 mg total) by mouth 2 (two) times daily. One po bid x 7 days 10/16/15   Junius Finner, PA-C  ondansetron (ZOFRAN) 4 MG tablet Take 1 tablet (4 mg total) by mouth every 6 (six) hours. As needed for nausea 10/16/15   Junius Finner, PA-C  polyethylene glycol (MIRALAX  / GLYCOLAX) packet Take 17 g by mouth daily. 10/16/15   Junius Finner, PA-C  traMADol (ULTRAM) 50 MG tablet Take 1 tablet (50 mg total) by mouth every 6 (six) hours as needed. 04/05/13   Renne Crigler, PA-C    Family History No family history on file.  Social History Social History  Substance Use Topics  . Smoking status: Current Every Day Smoker  . Smokeless tobacco: Never Used  . Alcohol use No   Allergies   Review of patient's allergies indicates no known allergies.   Review of Systems Review of Systems  Constitutional: Positive for fever.  HENT: Positive for sore throat. Negative for rhinorrhea.   Respiratory: Positive for cough.   Gastrointestinal: Positive for nausea and vomiting.   Physical Exam Updated Vital Signs BP 114/57 (BP Location: Right Arm)   Pulse 68   Temp 98 F (36.7 C) (Oral)   Resp 18   SpO2 99%   Physical Exam  Constitutional: He is oriented to person, place, and time. He appears well-developed and well-nourished.  HENT:  Head: Normocephalic and atraumatic.  External ears, ear canals, TMs normal bilaterally. Oropharynx is erythematous, tonsils mildly enlarged bilaterally. White exudate present bilaterally. Uvula is midline. No trismus.  Eyes: Conjunctivae are normal. Pupils are equal, round, and reactive to light. Right eye exhibits no discharge. Left eye exhibits no discharge. No scleral icterus.  Neck: Normal range of motion. No JVD present. No tracheal deviation present.  Cardiovascular: Normal rate, regular rhythm,  normal heart sounds and intact distal pulses.   No murmur heard. Pulmonary/Chest: Effort normal and breath sounds normal. No stridor. No respiratory distress. He has no wheezes. He has no rales.  Neurological: He is alert and oriented to person, place, and time. Coordination normal.  Skin: Skin is warm.  Psychiatric: He has a normal mood and affect. His behavior is normal. Judgment and thought content normal.  Nursing note and vitals  reviewed.  ED Treatments / Results  Labs (all labs ordered are listed, but only abnormal results are displayed) Labs Reviewed - No data to display  EKG  EKG Interpretation None       Radiology No results found.  Procedures Procedures (including critical care time)  Medications Ordered in ED Medications - No data to display  DIAGNOSTIC STUDIES:  Oxygen Saturation is 99% on RA, normal by my interpretation.    COORDINATION OF CARE:  12:30 PM Discussed treatment plan with pt at bedside and pt agreed to plan.  Initial Impression / Assessment and Plan / ED Course  I have reviewed the triage vital signs and the nursing notes.  Pertinent labs & imaging results that were available during my care of the patient were reviewed by me and considered in my medical decision making (see chart for details).  Clinical Course    Patient was also throat onset yesterday. Patient with oropharyngeal erythema, bilaterally mild enlargement of the tonsils with white exudates. No change in voice. No difficulty swallowing. No trismus. Uvula is midline, no evidence of peritonsillar abscess. He is afebrile, nontoxic appearing. Rapid strep is negative. We'll treat as viral pharyngitis. Strep cultures were sent. Return precautions discussed. Treatment at home with Tylenol, ibuprofen, salt water gargles. Patient agrees to the plan and voices understanding of the discharge instructions.  Vitals:   01/19/16 1218  BP: 114/57  Pulse: 68  Resp: 18  Temp: 98 F (36.7 C)  TempSrc: Oral  SpO2: 99%     I personally performed the services described in this documentation, which was scribed in my presence. The recorded information has been reviewed and is accurate.   Final Clinical Impressions(s) / ED Diagnoses   Final diagnoses:  Pharyngitis    New Prescriptions New Prescriptions   No medications on file     Jaynie Crumbleatyana Tristy Udovich, PA-C 01/19/16 1312    Glynn OctaveStephen Rancour, MD 01/19/16 [phone]

## 2016-01-21 LAB — CULTURE, GROUP A STREP (THRC)

## 2016-08-11 ENCOUNTER — Encounter (HOSPITAL_COMMUNITY): Payer: Self-pay | Admitting: Emergency Medicine

## 2016-08-11 ENCOUNTER — Emergency Department (HOSPITAL_COMMUNITY)
Admission: EM | Admit: 2016-08-11 | Discharge: 2016-08-11 | Disposition: A | Discharge status: Home / Self Care | Payer: Self-pay | Attending: Emergency Medicine | Admitting: Emergency Medicine

## 2016-08-11 DIAGNOSIS — Z5321 Procedure and treatment not carried out due to patient leaving prior to being seen by health care provider: Secondary | ICD-10-CM | POA: Insufficient documentation

## 2016-08-11 DIAGNOSIS — F17200 Nicotine dependence, unspecified, uncomplicated: Secondary | ICD-10-CM | POA: Insufficient documentation

## 2016-08-11 DIAGNOSIS — J45909 Unspecified asthma, uncomplicated: Secondary | ICD-10-CM | POA: Insufficient documentation

## 2016-08-11 DIAGNOSIS — R197 Diarrhea, unspecified: Secondary | ICD-10-CM | POA: Insufficient documentation

## 2016-08-11 DIAGNOSIS — R112 Nausea with vomiting, unspecified: Secondary | ICD-10-CM | POA: Insufficient documentation

## 2016-08-11 LAB — URINALYSIS, ROUTINE W REFLEX MICROSCOPIC
Bilirubin Urine: NEGATIVE
Glucose, UA: NEGATIVE mg/dL
Hgb urine dipstick: NEGATIVE
Ketones, ur: NEGATIVE mg/dL
Leukocytes, UA: NEGATIVE
Nitrite: NEGATIVE
Protein, ur: NEGATIVE mg/dL
Specific Gravity, Urine: 1.019 (ref 1.005–1.030)
pH: 7 (ref 5.0–8.0)

## 2016-08-11 LAB — COMPREHENSIVE METABOLIC PANEL
ALK PHOS: 34 U/L — AB (ref 38–126)
ALT: 15 U/L — ABNORMAL LOW (ref 17–63)
ANION GAP: 9 (ref 5–15)
AST: 20 U/L (ref 15–41)
Albumin: 4.2 g/dL (ref 3.5–5.0)
BILIRUBIN TOTAL: 0.7 mg/dL (ref 0.3–1.2)
BUN: 7 mg/dL (ref 6–20)
CALCIUM: 9.4 mg/dL (ref 8.9–10.3)
CO2: 24 mmol/L (ref 22–32)
Chloride: 106 mmol/L (ref 101–111)
Creatinine, Ser: 0.94 mg/dL (ref 0.61–1.24)
Glucose, Bld: 95 mg/dL (ref 65–99)
Potassium: 4 mmol/L (ref 3.5–5.1)
Sodium: 139 mmol/L (ref 135–145)
TOTAL PROTEIN: 6.5 g/dL (ref 6.5–8.1)

## 2016-08-11 LAB — LIPASE, BLOOD: Lipase: 20 U/L (ref 11–51)

## 2016-08-11 LAB — CBC
HCT: 41.3 % (ref 39.0–52.0)
HEMOGLOBIN: 13.4 g/dL (ref 13.0–17.0)
MCH: 27.2 pg (ref 26.0–34.0)
MCHC: 32.4 g/dL (ref 30.0–36.0)
MCV: 83.8 fL (ref 78.0–100.0)
Platelets: 209 10*3/uL (ref 150–400)
RBC: 4.93 MIL/uL (ref 4.22–5.81)
RDW: 12.9 % (ref 11.5–15.5)
WBC: 4.3 10*3/uL (ref 4.0–10.5)

## 2016-08-11 NOTE — ED Notes (Signed)
Pt did not answer for vitals recheck 

## 2016-08-11 NOTE — ED Notes (Signed)
Pt up to nurses station requesting if the hospital will call him with his results if he leaves to go to work. Pt encouraged to stay and see a physician. Pt calm and cooperative, back to seat.

## 2016-08-11 NOTE — ED Triage Notes (Signed)
Pt to ER for evaluation of nausea vomiting and diarrhea onset yesterday. Reports virus has been going around work place and believes he has caught it. VSS. Reports unable to keep fluids down. 3 episodes of emesis and 2 episodes of diarrhea in 24 hours. NAD.

## 2016-10-23 ENCOUNTER — Encounter (HOSPITAL_COMMUNITY): Payer: Self-pay | Admitting: Emergency Medicine

## 2016-10-23 ENCOUNTER — Emergency Department (HOSPITAL_COMMUNITY)
Admission: EM | Admit: 2016-10-23 | Discharge: 2016-10-23 | Disposition: A | Discharge status: Home / Self Care | Payer: Self-pay | Attending: Emergency Medicine | Admitting: Emergency Medicine

## 2016-10-23 DIAGNOSIS — K0889 Other specified disorders of teeth and supporting structures: Secondary | ICD-10-CM | POA: Insufficient documentation

## 2016-10-23 DIAGNOSIS — F17200 Nicotine dependence, unspecified, uncomplicated: Secondary | ICD-10-CM | POA: Insufficient documentation

## 2016-10-23 DIAGNOSIS — J45909 Unspecified asthma, uncomplicated: Secondary | ICD-10-CM | POA: Insufficient documentation

## 2016-10-23 MED ORDER — AMOXICILLIN 500 MG PO CAPS: 500.0000 mg | ORAL_CAPSULE | Freq: Three times a day (TID) | ORAL | 0 refills | Status: DC

## 2016-10-23 NOTE — ED Provider Notes (Signed)
[facility]-EMERGENCY DEPT Provider Note   CSN: [id number] Arrival date & time: 10/23/16  [id number]  By signing my name below, I, Sonum Patel, attest that this documentation has been prepared under the direction and in the presence of Graciella FreerLindsey Leda Bellefeuille, PA-C. Electronically Signed: Leone PayorSonum Patel, Scribe. 10/23/16. 10:58 AM.  History   Chief Complaint Chief Complaint  Patient presents with  . Abscess    The history is provided by the patient. No language interpreter was used.     HPI Comments: Zachary Robinson is a 26 y.o. male who presents to the Emergency Department complaining of a constant, unchanged bump to the posterior right lower gum area that he noticed this morning while brushing his teeth. He notes feeling a subjective fever this morning but did not measure it. Reports mild associated nausea. Patient has had no difficulty swallowing and is able tolerate his secretions and by mouth. He has not followed by dentist. He denies associated dental pain, difficulty eating or swallowing, vomiting.    Past Medical History:  Diagnosis Date  . Asthma     There are no active problems to display for this patient.   History reviewed. No pertinent surgical history.     Home Medications    Prior to Admission medications   Medication Sig Start Date End Date Taking? Authorizing Provider  amoxicillin (AMOXIL) 500 MG capsule Take 1 capsule (500 mg total) by mouth 3 (three) times daily. 10/23/16   Maxwell CaulLayden, Ellinore Merced A, PA-C  ciprofloxacin (CIPRO) 500 MG tablet Take 1 tablet (500 mg total) by mouth 2 (two) times daily. One po bid x 7 days 10/16/15   Lurene ShadowPhelps, Erin O, PA-C  ibuprofen (ADVIL,MOTRIN) 600 MG tablet Take 1 tablet (600 mg total) by mouth every 6 (six) hours as needed. 04/05/13   Renne CriglerGeiple, Joshua, PA-C  metroNIDAZOLE (FLAGYL) 500 MG tablet Take 1 tablet (500 mg total) by mouth 2 (two) times daily. One po bid x 7 days 10/16/15   Lurene ShadowPhelps, Erin O, PA-C  ondansetron (ZOFRAN) 4 MG tablet Take 1 tablet (4 mg  total) by mouth every 6 (six) hours. As needed for nausea 10/16/15   Lurene ShadowPhelps, Erin O, PA-C  polyethylene glycol [facility](MIRALAX / GLYCOLAX) packet Take 17 g by mouth daily. 10/16/15   Lurene ShadowPhelps, Erin O, PA-C  traMADol (ULTRAM) 50 MG tablet Take 1 tablet (50 mg total) by mouth every 6 (six) hours as needed. 04/05/13   Renne CriglerGeiple, Joshua, PA-C    Family History No family history on file.  Social History Social History  Substance Use Topics  . Smoking status: Current Every Day Smoker  . Smokeless tobacco: Never Used  . Alcohol use No     Allergies   Patient has no known allergies.   Review of Systems Review of Systems  Constitutional: Positive for fever (Subjective).  HENT: Negative for dental problem and trouble swallowing.        +gum swelling  Gastrointestinal: Positive for nausea. Negative for vomiting.     Physical Exam Updated Vital Signs BP 107/65 (BP Location: Right Arm)   Pulse 63   Temp 98.5 F (36.9 C) (Oral)   Resp 20   Ht 5\' 10"  (1.778 m)   Wt 71.2 kg (157 lb)   SpO2 100%   BMI 22.53 kg/m   Physical Exam  Constitutional: He appears well-developed and well-nourished.  Sitting comfortably on examination table  HENT:  Head: Normocephalic and atraumatic.  Mouth/Throat: Uvula is midline, oropharynx is clear and moist and mucous membranes are normal.  No facial swelling, warmth or erythema. No palpable mass noted on facial examination. Partially impacted lower wisdom teeth bilaterally. No surrounding gingival erythema or swelling. No gingival swelling or erythema. No evidence of abscess. No trismus.  Eyes: Conjunctivae and EOM are normal. Right eye exhibits no discharge. Left eye exhibits no discharge. No scleral icterus.  Neck:  No swelling or erythema noted to the neck  Pulmonary/Chest: Effort normal.  Neurological: He is alert.  Skin: Skin is warm and dry.  Psychiatric: He has a normal mood and affect. His speech is normal and behavior is normal.  Nursing note and vitals  reviewed.    ED Treatments / Results  DIAGNOSTIC STUDIES: Oxygen Saturation is 100% on RA, normal by my interpretation.    COORDINATION OF CARE: 10:58 AM Discussed treatment plan with pt at bedside and pt agreed to plan.   Labs (all labs ordered are listed, but only abnormal results are displayed) Labs Reviewed - No data to display  EKG  EKG Interpretation None       Radiology No results found.  Procedures Procedures (including critical care time)  Medications Ordered in ED Medications - No data to display   Initial Impression / Assessment and Plan / ED Course  I have reviewed the triage vital signs and the nursing notes.  Pertinent labs & imaging results that were available during my care of the patient were reviewed by me and considered in my medical decision making (see chart for details).     26 year old male who presents with a bump in the left side of his mouth that began this morning. Patient is afebrile, non-toxic appearing, sitting comfortably on examination table. Vital signs reviewed and stable. Patient does have partially impacted lower wisdom teeth bilaterally, which I suspect is where pain is coming from. He has no surrounding gingival erythema or fluctuance the wisdom teeth. No evidence of abscess requiring immediate incision and drainage. Exam not concerning for Ludwig's angina or pharyngeal abscess. Will treat with antibiotic coverage for potential early development of abscess. Otherwise conservative therapy discussed. Patient instructed to follow-up with dentist referral provided. Stable for discharge at this time. Strict return precautions discussed. Patient expresses understanding and agreement to plan.  Final Clinical Impressions(s) / ED Diagnoses   Final diagnoses:  Pain, dental    New Prescriptions New Prescriptions   AMOXICILLIN (AMOXIL) 500 MG CAPSULE    Take 1 capsule (500 mg total) by mouth 3 (three) times daily.   I personally performed  the services described in this documentation, which was scribed in my presence. The recorded information has been reviewed and is accurate.    Maxwell Caul, PA-C 10/23/16 1138    Doug Sou, MD 10/23/16 [phone]

## 2016-10-23 NOTE — ED Notes (Signed)
Pt reports woke with right gum swelling and pain this am.

## 2016-10-23 NOTE — Discharge Instructions (Signed)
Take antibiotics as directed. Please take all of your antibiotics until finished.  You can take Tylenol or Ibuprofen as directed for pain.  Follow-up with referred dentist for further evaluation.  Return the emergency Department for any worsening pain, fever, facial swelling, difficulty swallowing, difficulty breathing or any other concerning symptoms.

## 2016-10-23 NOTE — ED Triage Notes (Signed)
Woke up today felt right side of face a small bump. Denies pain concerned of an abscess.

## 2017-08-24 ENCOUNTER — Emergency Department (HOSPITAL_COMMUNITY)
Admission: EM | Admit: 2017-08-24 | Discharge: 2017-08-24 | Disposition: A | Discharge status: Home / Self Care | Payer: Self-pay | Attending: Emergency Medicine | Admitting: Emergency Medicine

## 2017-08-24 ENCOUNTER — Encounter (HOSPITAL_COMMUNITY): Payer: Self-pay | Admitting: Emergency Medicine

## 2017-08-24 DIAGNOSIS — R112 Nausea with vomiting, unspecified: Secondary | ICD-10-CM | POA: Insufficient documentation

## 2017-08-24 DIAGNOSIS — Z5321 Procedure and treatment not carried out due to patient leaving prior to being seen by health care provider: Secondary | ICD-10-CM | POA: Insufficient documentation

## 2017-08-24 LAB — CBC
HCT: 45.5 % (ref 39.0–52.0)
Hemoglobin: 14.8 g/dL (ref 13.0–17.0)
MCH: 28 pg (ref 26.0–34.0)
MCHC: 32.5 g/dL (ref 30.0–36.0)
MCV: 86 fL (ref 78.0–100.0)
Platelets: 251 10*3/uL (ref 150–400)
RBC: 5.29 MIL/uL (ref 4.22–5.81)
RDW: 12.9 % (ref 11.5–15.5)
WBC: 6.7 10*3/uL (ref 4.0–10.5)

## 2017-08-24 LAB — COMPREHENSIVE METABOLIC PANEL
ALT: 26 U/L (ref 17–63)
AST: 23 U/L (ref 15–41)
Albumin: 4.5 g/dL (ref 3.5–5.0)
Alkaline Phosphatase: 45 U/L (ref 38–126)
Anion gap: 8 (ref 5–15)
BUN: 10 mg/dL (ref 6–20)
CHLORIDE: 106 mmol/L (ref 101–111)
CO2: 27 mmol/L (ref 22–32)
Calcium: 9.8 mg/dL (ref 8.9–10.3)
Creatinine, Ser: 1.07 mg/dL (ref 0.61–1.24)
GFR calc Af Amer: >60 mL/min (ref >60)
Glucose, Bld: 94 mg/dL (ref 65–99)
POTASSIUM: 3.9 mmol/L (ref 3.5–5.1)
SODIUM: 141 mmol/L (ref 135–145)
Total Bilirubin: 1.4 mg/dL — ABNORMAL HIGH (ref 0.3–1.2)
Total Protein: 7.9 g/dL (ref 6.5–8.1)

## 2017-08-24 LAB — LIPASE, BLOOD: LIPASE: 27 U/L (ref 11–51)

## 2017-08-24 NOTE — ED Triage Notes (Signed)
Patient here from home with complaints nausea vomiting only in the morning when wakening. Denies abdominal pain. Reports that he is fine at night.

## 2018-01-08 ENCOUNTER — Encounter (HOSPITAL_COMMUNITY): Payer: Self-pay | Admitting: Emergency Medicine

## 2018-01-08 ENCOUNTER — Ambulatory Visit (HOSPITAL_COMMUNITY)
Admission: EM | Admit: 2018-01-08 | Discharge: 2018-01-08 | Disposition: A | Discharge status: Home / Self Care | Payer: Self-pay | Attending: Emergency Medicine | Admitting: Emergency Medicine

## 2018-01-08 ENCOUNTER — Emergency Department (HOSPITAL_COMMUNITY)
Admission: EM | Admit: 2018-01-08 | Discharge: 2018-01-08 | Discharge status: Against Medical Advice | Payer: Self-pay | Attending: Emergency Medicine | Admitting: Emergency Medicine

## 2018-01-08 DIAGNOSIS — R0981 Nasal congestion: Secondary | ICD-10-CM | POA: Insufficient documentation

## 2018-01-08 DIAGNOSIS — Z5321 Procedure and treatment not carried out due to patient leaving prior to being seen by health care provider: Secondary | ICD-10-CM | POA: Insufficient documentation

## 2018-01-08 DIAGNOSIS — A084 Viral intestinal infection, unspecified: Secondary | ICD-10-CM

## 2018-01-08 MED ORDER — ONDANSETRON HCL 4 MG PO TABS: 4.0000 mg | ORAL_TABLET | Freq: Four times a day (QID) | ORAL | 0 refills | Status: DC

## 2018-01-08 NOTE — ED Provider Notes (Signed)
[facility]   [id number] 01/08/18 Arrival Time: 1204  CC: nausea, vomiting, and diarrhea  SUBJECTIVE:  Zachary Robinson is a 27 y.o. male who presents with complaint of improving nausea, vomiting x 2 episodes, and diarrhea x 10 episodes x 3 days ago.  Reports close contacts at work with similar symptoms.  Mild abdominal discomfort that feels like he did an ab work-up.  Has tried OTC medications like tylenol with minimal relief.  Denies alleviating or aggravating factors.  Reports similar symptoms in the past.  Last BM this morning with looser stools.  Complains of associated fever, recorded 100 at home yesterday, and decreased appetite.  Denies chills, weight changes, chest pain, SOB, constipation, hematochezia, melena, dysuria, difficulty urinating, increased frequency or urgency, flank pain, loss of bowel or bladder function.  No LMP for male patient.  ROS: As per HPI.  Past Medical History:  Diagnosis Date  . Asthma    History reviewed. No pertinent surgical history. No Known Allergies No current facility-administered medications on file prior to encounter.    Current Outpatient Medications on File Prior to Encounter  Medication Sig Dispense Refill  . ibuprofen (ADVIL,MOTRIN) 600 MG tablet Take 1 tablet (600 mg total) by mouth every 6 (six) hours as needed. 20 tablet 0  . polyethylene glycol (MIRALAX / GLYCOLAX) packet Take 17 g by mouth daily. 14 each 0   Social History   Socioeconomic History  . Marital status: Single    Spouse name: Not on file  . Number of children: Not on file  . Years of education: Not on file  . Highest education level: Not on file  Occupational History  . Not on file  Social Needs  . Financial resource strain: Not on file  . Food insecurity:    Worry: Not on file    Inability: Not on file  . Transportation needs:    Medical: Not on file    Non-medical: Not on file  Tobacco Use  . Smoking status: Current Every Day Smoker  .  Smokeless tobacco: Never Used  Substance and Sexual Activity  . Alcohol use: No  . Drug use: No  . Sexual activity: Yes  Lifestyle  . Physical activity:    Days per week: Not on file    Minutes per session: Not on file  . Stress: Not on file  Relationships  . Social connections:    Talks on phone: Not on file    Gets together: Not on file    Attends religious service: Not on file    Active member of club or organization: Not on file    Attends meetings of clubs or organizations: Not on file    Relationship status: Not on file  . Intimate partner violence:    Fear of current or ex partner: Not on file    Emotionally abused: Not on file    Physically abused: Not on file    Forced sexual activity: Not on file  Other Topics Concern  . Not on file  Social History Narrative  . Not on file   Family History  Problem Relation Age of Onset  . Healthy Mother      OBJECTIVE:  Vitals:   01/08/18 1242  BP: (!) 103/53  Pulse: 62  Resp: 18  Temp: 98.1 F (36.7 C)  TempSrc: Oral  SpO2: 100%    General appearance: AOx3 in no acute distress; nontoxic appearance; using mobile device without difficulty HEENT: NCAT.  Oropharynx clear.  Lungs: clear to auscultation bilaterally without adventitious breath sounds Heart: regular rate and rhythm.  Radial pulses 2+ symmetrical bilaterally Abdomen: soft, non-distended; normal active bowel sounds; non-tender to light and deep palpation; nontender at McBurney's point; no guarding Back: no CVA tenderness Extremities: no edema; symmetrical with no gross deformities Skin: warm and dry Neurologic: normal gait Psychological: alert and cooperative; normal mood and affect  ASSESSMENT & PLAN:  1. Viral gastroenteritis     Meds ordered this encounter  Medications  . ondansetron (ZOFRAN) 4 MG tablet    Sig: Take 1 tablet (4 mg total) by mouth every 6 (six) hours.    Dispense:  12 tablet    Refill:  0    Order Specific Question:    Supervising Provider    Answer:   Isa RankinMURRAY, LAURA WILSON [[id number]]    Get rest and drink fluids Zofran prescribed.  Take as directed.   DIET Instructions: 30 minutes after taking nausea medicine, begin with sips of clear liquids. If able to hold down 2 - 4 ounces for 30 minutes, begin drinking more. Increase your fluid intake to replace losses. Clear liquids only for 24 hours (water, tea, sport drinks, clear flat ginger ale or cola and juices, broth, jello, popsicles, ect). Advance to bland foods, applesauce, rice, baked or boiled chicken, ect. Avoid milk, greasy foods and anything that doesn't agree with you. If symptoms persists follow up with PCP If you experience new or worsening symptoms return or go to ER If vomiting persists and you are unable to hold fluids or diarrhea persists more than 4 days, or becomes bloody, or if you develop high fever or abdominal pain, return here, see your doctor or go to the ER.    Reviewed expectations re: course of current medical issues. Questions answered. Outlined signs and symptoms indicating need for more acute intervention. Patient verbalized understanding. After Visit Summary given.   Rennis HardingWurst, Bertel Venard, PA-C 01/08/18 1315

## 2018-01-08 NOTE — ED Triage Notes (Signed)
Pt here for URI sx x 4 days 

## 2018-01-08 NOTE — ED Triage Notes (Signed)
Pt c/o nasal congestion and flu-like symptoms x 3 days. Requesting work note.

## 2018-01-08 NOTE — Discharge Instructions (Addendum)
Get rest and drink fluids Zofran prescribed.  Take as directed.   DIET Instructions: 30 minutes after taking nausea medicine, begin with sips of clear liquids. If able to hold down 2 - 4 ounces for 30 minutes, begin drinking more. Increase your fluid intake to replace losses. Clear liquids only for 24 hours (water, tea, sport drinks, clear flat ginger ale or cola and juices, broth, jello, popsicles, ect). Advance to bland foods, applesauce, rice, baked or boiled chicken, ect. Avoid milk, greasy foods and anything that doesnt agree with you. If symptoms persists follow up with PCP If you experience new or worsening symptoms return or go to ER If vomiting persists and you are unable to hold fluids or diarrhea persists more than 4 days, or becomes bloody, or if you develop high fever or abdominal pain, return here, see your doctor or go to the ER.

## 2018-01-08 NOTE — ED Notes (Signed)
Pt. Called to see if they were still here, no response when called x3.

## 2018-01-08 NOTE — ED Notes (Signed)
Pt called to go back to a room with no response.

## 2019-09-28 ENCOUNTER — Emergency Department (HOSPITAL_COMMUNITY): Payer: Self-pay

## 2019-09-28 ENCOUNTER — Encounter (HOSPITAL_COMMUNITY): Payer: Self-pay | Admitting: Emergency Medicine

## 2019-09-28 ENCOUNTER — Emergency Department (HOSPITAL_COMMUNITY)
Admission: EM | Admit: 2019-09-28 | Discharge: 2019-09-28 | Disposition: A | Discharge status: Home / Self Care | Payer: Self-pay | Attending: Emergency Medicine | Admitting: Emergency Medicine

## 2019-09-28 ENCOUNTER — Other Ambulatory Visit: Payer: Self-pay

## 2019-09-28 DIAGNOSIS — S63501A Unspecified sprain of right wrist, initial encounter: Secondary | ICD-10-CM | POA: Insufficient documentation

## 2019-09-28 DIAGNOSIS — Y9389 Activity, other specified: Secondary | ICD-10-CM | POA: Insufficient documentation

## 2019-09-28 DIAGNOSIS — J45909 Unspecified asthma, uncomplicated: Secondary | ICD-10-CM | POA: Insufficient documentation

## 2019-09-28 DIAGNOSIS — W2209XA Striking against other stationary object, initial encounter: Secondary | ICD-10-CM | POA: Insufficient documentation

## 2019-09-28 DIAGNOSIS — Y929 Unspecified place or not applicable: Secondary | ICD-10-CM | POA: Insufficient documentation

## 2019-09-28 DIAGNOSIS — F17200 Nicotine dependence, unspecified, uncomplicated: Secondary | ICD-10-CM | POA: Insufficient documentation

## 2019-09-28 DIAGNOSIS — Z79899 Other long term (current) drug therapy: Secondary | ICD-10-CM | POA: Insufficient documentation

## 2019-09-28 DIAGNOSIS — Y999 Unspecified external cause status: Secondary | ICD-10-CM | POA: Insufficient documentation

## 2019-09-28 MED ORDER — IBUPROFEN 200 MG PO TABS
600.0000 mg | ORAL_TABLET | Freq: Once | ORAL | Status: AC
  Administered 2019-09-28: 600 mg via ORAL
  Filled 2019-09-28: qty 1

## 2019-09-28 NOTE — ED Provider Notes (Signed)
[place] EMERGENCY DEPARTMENT Provider Note   CSN: [id number] Arrival date & time: 09/28/19  1100     History Chief Complaint  Patient presents with  . Wrist Pain    Zachary Robinson is a 29 y.o. male.  HPI 30 year old male presents with right wrist injury.  Yesterday he was moving furniture and hit the dorsal aspect of his wrist on the wall.  It is swollen.  It is painful to squeeze his hand shot but he has full range of motion otherwise.  No weakness or numbness.  Has not take anything for the pain.   Past Medical History:  Diagnosis Date  . Asthma     There are no problems to display for this patient.   History reviewed. No pertinent surgical history.     Family History  Problem Relation Age of Onset  . Healthy Mother     Social History   Tobacco Use  . Smoking status: Current Every Day Smoker  . Smokeless tobacco: Never Used  Substance Use Topics  . Alcohol use: No  . Drug use: No    Home Medications Prior to Admission medications   Medication Sig Start Date End Date Taking? Authorizing Provider  ibuprofen (ADVIL,MOTRIN) 600 MG tablet Take 1 tablet (600 mg total) by mouth every 6 (six) hours as needed. 04/05/13   Carlisle Cater, PA-C  ondansetron (ZOFRAN) 4 MG tablet Take 1 tablet (4 mg total) by mouth every 6 (six) hours. 01/08/18   Wurst, [place], PA-C  polyethylene glycol (MIRALAX / GLYCOLAX) packet Take 17 g by mouth daily. 10/16/15   Noe Gens, PA-C    Allergies    Patient has no known allergies.  Review of Systems   Review of Systems  Musculoskeletal: Positive for arthralgias and joint swelling.  Neurological: Negative for weakness and numbness.    Physical Exam Updated Vital Signs BP 116/69 (BP Location: Right Arm)   Pulse 72   Temp 98.7 F (37.1 C) (Oral)   Resp 16   SpO2 100%   Physical Exam Vitals and nursing note reviewed.  Constitutional:      Appearance: He is well-developed.  HENT:     Head:  Normocephalic and atraumatic.     Right Ear: External ear normal.     Left Ear: External ear normal.     Nose: Nose normal.  Eyes:     General:        Right eye: No discharge.        Left eye: No discharge.  Cardiovascular:     Rate and Rhythm: Normal rate and regular rhythm.     Pulses:          Radial pulses are 2+ on the right side.  Pulmonary:     Effort: Pulmonary effort is normal.  Abdominal:     General: There is no distension.  Musculoskeletal:     Right wrist: Swelling and bony tenderness present. No deformity or snuff box tenderness. Decreased range of motion (mild, extension).     Right hand: No swelling, deformity or tenderness.     Cervical back: Neck supple.     Comments: Normal strength/sensation in right hand. Normal radial, ulnar, median nerve testing Mild tenderness over distal ulna.  Skin:    General: Skin is warm and dry.  Neurological:     Mental Status: He is alert.  Psychiatric:        Mood and Affect: Mood is not anxious.  ED Results / Procedures / Treatments   Labs (all labs ordered are listed, but only abnormal results are displayed) Labs Reviewed - No data to display  EKG None  Radiology DG Wrist Complete Right  Result Date: 09/28/2019 CLINICAL DATA:  Right wrist pain. EXAM: RIGHT WRIST - COMPLETE 3+ VIEW COMPARISON:  None. FINDINGS: The joint spaces are maintained. No acute fracture is identified. No degenerative changes or chondrocalcinosis. IMPRESSION: No acute bony findings. Electronically Signed   By: Rudie Meyer M.D.   On: 09/28/2019 12:51    Procedures Procedures (including critical care time)  Medications Ordered in ED Medications  ibuprofen (ADVIL) tablet 600 mg (600 mg Oral Given 09/28/19 1203)    ED Course  I have reviewed the triage vital signs and the nursing notes.  Pertinent labs & imaging results that were available during my care of the patient were reviewed by me and considered in my medical decision making (see  chart for details).    MDM Rules/Calculators/A&P                      X-ray personally reviewed and is without acute bony injury or dislocation.  Neurovascularly intact.  We will treat with a removable wrist brace and NSAIDs.  Likely a sprain. Final Clinical Impression(s) / ED Diagnoses Final diagnoses:  Sprain of right wrist, initial encounter    Rx / DC Orders ED Discharge Orders    None       Pricilla Loveless, MD 09/28/19 1311

## 2019-09-28 NOTE — ED Notes (Signed)
Pt transported to Xray. 

## 2019-09-28 NOTE — ED Notes (Signed)
Pt received wrist brace from ortho and then left without receiving discharge instructions.

## 2019-09-28 NOTE — Progress Notes (Signed)
Orthopedic Tech Progress Note Patient Details:  Zachary Robinson [facility] 11/27/1990 [id number]  Ortho Devices Type of Ortho Device: Velcro wrist splint Ortho Device/Splint Location: RLE Ortho Device/Splint Interventions: Ordered, Application, Adjustment   Post Interventions Patient Tolerated: Well Instructions Provided: Care of device   [name] 09/28/2019, 2:02 PM

## 2019-09-28 NOTE — ED Triage Notes (Signed)
Pt here from home for wrist pain. Pt was moving furniture yesterday when he felt like he moved his R wrist "weird". Pt states it is swollen, pt tried icing it last night but did not help. Pt cannot make a fist with R hand.

## 2020-01-21 ENCOUNTER — Emergency Department (HOSPITAL_COMMUNITY)
Admission: EM | Admit: 2020-01-21 | Discharge: 2020-01-21 | Disposition: A | Discharge status: Home / Self Care | Payer: Self-pay | Attending: Emergency Medicine | Admitting: Emergency Medicine

## 2020-01-21 ENCOUNTER — Encounter (HOSPITAL_COMMUNITY): Payer: Self-pay | Admitting: Emergency Medicine

## 2020-01-21 DIAGNOSIS — R220 Localized swelling, mass and lump, head: Secondary | ICD-10-CM | POA: Insufficient documentation

## 2020-01-21 DIAGNOSIS — R519 Headache, unspecified: Secondary | ICD-10-CM | POA: Insufficient documentation

## 2020-01-21 DIAGNOSIS — Z5321 Procedure and treatment not carried out due to patient leaving prior to being seen by health care provider: Secondary | ICD-10-CM | POA: Insufficient documentation

## 2020-01-21 NOTE — ED Notes (Signed)
Called patient for room, no answer 

## 2020-01-21 NOTE — ED Triage Notes (Signed)
Pt. Stated, I have a knot on my forehead for 3 days.

## 2020-01-21 NOTE — ED Notes (Signed)
Called pt no answer and don't see them in waitingroom nor outside

## 2020-01-24 ENCOUNTER — Emergency Department (HOSPITAL_COMMUNITY)
Admission: EM | Admit: 2020-01-24 | Discharge: 2020-01-24 | Disposition: A | Discharge status: Home / Self Care | Payer: Self-pay | Attending: Emergency Medicine | Admitting: Emergency Medicine

## 2020-01-24 ENCOUNTER — Emergency Department (HOSPITAL_COMMUNITY): Payer: Self-pay

## 2020-01-24 ENCOUNTER — Other Ambulatory Visit: Payer: Self-pay

## 2020-01-24 DIAGNOSIS — R0789 Other chest pain: Secondary | ICD-10-CM | POA: Insufficient documentation

## 2020-01-24 DIAGNOSIS — R0602 Shortness of breath: Secondary | ICD-10-CM | POA: Insufficient documentation

## 2020-01-24 DIAGNOSIS — F17210 Nicotine dependence, cigarettes, uncomplicated: Secondary | ICD-10-CM | POA: Insufficient documentation

## 2020-01-24 DIAGNOSIS — J45909 Unspecified asthma, uncomplicated: Secondary | ICD-10-CM | POA: Insufficient documentation

## 2020-01-24 MED ORDER — NAPROXEN 500 MG PO TABS: 500.0000 mg | ORAL_TABLET | Freq: Two times a day (BID) | ORAL | 0 refills | Status: AC

## 2020-01-24 NOTE — ED Provider Notes (Signed)
[facility]-EMERGENCY DEPT Provider Note   CSN: [id number] Arrival date & time: 01/24/20  0151     History Chief Complaint  Patient presents with  . Anxiety    Zachary Robinson is a 29 y.o. male.  HPI   Patient is a 29 year old male history of asthma who presents the emergency department today for evaluation of shortness of breath.  Patient states he woke up this morning and experienced shortness of breath.  He also had a stabbing pain to his chest and felt some chest tightness.  He denies any coughing.  He denies any recent URI symptoms or fevers.  He does admit that he smokes marijuana daily.  He does not use any other drugs.  He states that since arriving to the emergency department his symptoms have completely resolved and he no longer has any shortness of breath, chest tightness or pleuritic pain.  Denies leg pain/swelling, hemoptysis, recent surgery/trauma, recent long travel, hormone use, personal hx of cancer, or hx of DVT/PE.    Past Medical History:  Diagnosis Date  . Asthma     There are no problems to display for this patient.   No past surgical history on file.     Family History  Problem Relation Age of Onset  . Healthy Mother     Social History   Tobacco Use  . Smoking status: Current Every Day Smoker  . Smokeless tobacco: Never Used  Substance Use Topics  . Alcohol use: No  . Drug use: No    Home Medications Prior to Admission medications   Medication Sig Start Date End Date Taking? Authorizing Provider  ibuprofen (ADVIL,MOTRIN) 600 MG tablet Take 1 tablet (600 mg total) by mouth every 6 (six) hours as needed. 04/05/13   Renne Crigler, PA-C  naproxen (NAPROSYN) 500 MG tablet Take 1 tablet (500 mg total) by mouth 2 (two) times daily for 7 days. 01/24/20 01/31/20  Hibah Odonnell S, PA-C  ondansetron (ZOFRAN) 4 MG tablet Take 1 tablet (4 mg total) by mouth every 6 (six) hours. 01/08/18   Wurst, [place], PA-C  polyethylene  glycol (MIRALAX / GLYCOLAX) packet Take 17 g by mouth daily. 10/16/15   Lurene Shadow, PA-C    Allergies    Patient has no known allergies.  Review of Systems   Review of Systems  Constitutional: Negative for fever.  HENT: Negative for ear pain and sore throat.   Eyes: Negative for visual disturbance.  Respiratory: Positive for shortness of breath. Negative for cough.   Cardiovascular: Positive for chest pain. Negative for leg swelling.  Gastrointestinal: Negative for abdominal pain, constipation, diarrhea, nausea and vomiting.  Genitourinary: Negative for dysuria and hematuria.  Musculoskeletal: Negative for back pain.  Skin: Negative for color change and rash.  Neurological: Negative for seizures and syncope.  All other systems reviewed and are negative.   Physical Exam Updated Vital Signs BP 131/75   Pulse 62   Temp 98 F (36.7 C) (Oral)   Resp 18   SpO2 94%   Physical Exam Vitals and nursing note reviewed.  Constitutional:      Appearance: He is well-developed.  HENT:     Head: Normocephalic and atraumatic.  Eyes:     Conjunctiva/sclera: Conjunctivae normal.  Cardiovascular:     Rate and Rhythm: Normal rate and regular rhythm.     Heart sounds: Normal heart sounds. No murmur heard.   Pulmonary:     Effort: Pulmonary effort is normal. No respiratory distress.  Breath sounds: Normal breath sounds. No wheezing, rhonchi or rales.  Chest:     Chest wall: Tenderness (left chest wall) present.  Abdominal:     General: Bowel sounds are normal.     Palpations: Abdomen is soft.     Tenderness: There is no abdominal tenderness. There is no guarding or rebound.  Musculoskeletal:        General: No tenderness.     Cervical back: Neck supple.     Right lower leg: No edema.     Left lower leg: No edema.  Skin:    General: Skin is warm and dry.  Neurological:     Mental Status: He is alert.     ED Results / Procedures / Treatments   Labs (all labs ordered are  listed, but only abnormal results are displayed) Labs Reviewed - No data to display  EKG None  Radiology DG Chest 2 View  Result Date: 01/24/2020 CLINICAL DATA:  Shortness of breath EXAM: CHEST - 2 VIEW COMPARISON:  07/07/2006 FINDINGS: The heart size and mediastinal contours are within normal limits. Both lungs are clear. The visualized skeletal structures are unremarkable. IMPRESSION: No active cardiopulmonary disease. Electronically Signed   By: Deatra Robinson M.D.   On: 01/24/2020 02:57    Procedures Procedures (including critical care time)  Smoking cessation instruction/counseling given:  counseled patient on the dangers of tobacco use, advised patient to stop smoking, and reviewed strategies to maximize success (also discussed marijuana use)   Medications Ordered in ED Medications - No data to display  ED Course  I have reviewed the triage vital signs and the nursing notes.  Pertinent labs & imaging results that were available during my care of the patient were reviewed by me and considered in my medical decision making (see chart for details).    MDM Rules/Calculators/A&P                          29 year old male here for evaluation of an episode of shortness of breath that occurred this morning.  He also had some chest tightness and a stabbing pain in his chest however on upon arrival to the ED and during my evaluation he is completely symptom-free.  He is satting 100% on room air throughout my evaluation without any tachypnea or dyspnea.  Lungs are clear to auscultation bilaterally and heart with regular rate and rhythm.  No clinical signs of VTE and have very low suspicion for this given complete resolution of symptoms.  Chest x-ray reviewed/interpreted and did not show any acute cardiopulmonary abnormalities.  EKG reviewed and shows no acute ST-T wave changes or concerning arrhythmia.  Patient ambulated in the room and maintained sats at 98-100% on room air and was in no  distress throughout ambulation.  Will treat symptoms for suspected pleurisy with anti-inflammatories.  Smoking cessation discussed.  Advised PCP follow-up and strict return precautions.  He voices understanding of plan and reasons to return.  All questions answered.  Patient stable for discharge.  Final Clinical Impression(s) / ED Diagnoses Final diagnoses:  Shortness of breath    Rx / DC Orders ED Discharge Orders         Ordered    naproxen (NAPROSYN) 500 MG tablet  2 times daily        01/24/20 [address] [place], PA-C 01/24/20 1043    Maia Plan, MD 01/26/20 1015

## 2020-01-24 NOTE — Discharge Instructions (Signed)
Take naproxen as directed   Please follow up with your primary care provider within 5-7 days for re-evaluation of your symptoms. If you do not have a primary care provider, information for a healthcare clinic has been provided for you to make arrangements for follow up care. Please return to the emergency department for any new or worsening symptoms.

## 2020-01-24 NOTE — ED Triage Notes (Signed)
Patient complaining of sob. Patient smells of marijuana. Patient states that he can not breathe. Patient has O2 sat -100% on room air. Patient VS stable.

## 2020-06-06 ENCOUNTER — Other Ambulatory Visit: Payer: Self-pay

## 2020-06-06 ENCOUNTER — Ambulatory Visit
Admission: EM | Admit: 2020-06-06 | Discharge: 2020-06-06 | Disposition: A | Discharge status: Home / Self Care | Payer: Self-pay | Attending: Emergency Medicine | Admitting: Emergency Medicine

## 2020-06-06 DIAGNOSIS — R519 Headache, unspecified: Secondary | ICD-10-CM

## 2020-06-06 DIAGNOSIS — R52 Pain, unspecified: Secondary | ICD-10-CM

## 2020-06-06 MED ORDER — IBUPROFEN 800 MG PO TABS: 800.0000 mg | ORAL_TABLET | Freq: Three times a day (TID) | ORAL | 0 refills | Status: DC

## 2020-06-06 NOTE — ED Provider Notes (Signed)
[facility]    CSN: [id number] Arrival date & time: 06/06/20  6759      History   Chief Complaint Chief Complaint  Patient presents with  . Headache    X 2 days  . Generalized Body Aches    X 2 days    HPI DEION Zachary Robinson is a 30 y.o. male history of asthma presenting today for evaluation of headache and body aches.  Reports for the past 2 days has had bitemporal headache, generalized body aches and associated fatigue.  Denies URI symptoms.  Maintaining appetite.  Reports Covid test 6 days ago, but this was before symptoms began.  Denies any known sick contacts or known Covid exposures.   HPI  Past Medical History:  Diagnosis Date  . Asthma     There are no problems to display for this patient.   History reviewed. No pertinent surgical history.     Home Medications    Prior to Admission medications   Medication Sig Start Date End Date Taking? Authorizing Provider  ibuprofen (ADVIL) 800 MG tablet Take 1 tablet (800 mg total) by mouth 3 (three) times daily. 06/06/20  Yes Sanjit Mcmichael, [place] C, PA-C    Family History Family History  Problem Relation Age of Onset  . Healthy Mother     Social History Social History   Tobacco Use  . Smoking status: Current Every Day Smoker  . Smokeless tobacco: Never Used  Vaping Use  . Vaping Use: Never used  Substance Use Topics  . Alcohol use: No  . Drug use: No     Allergies   Patient has no known allergies.   Review of Systems Review of Systems  Constitutional: Negative for activity change, appetite change, chills, fatigue and fever.  HENT: Negative for congestion, ear pain, rhinorrhea, sinus pressure, sore throat and trouble swallowing.   Eyes: Negative for discharge and redness.  Respiratory: Negative for cough, chest tightness and shortness of breath.   Cardiovascular: Negative for chest pain.  Gastrointestinal: Negative for abdominal pain, diarrhea, nausea and vomiting.  Musculoskeletal:  Positive for myalgias.  Skin: Negative for rash.  Neurological: Positive for headaches. Negative for dizziness and light-headedness.     Physical Exam Triage Vital Signs ED Triage Vitals  Enc Vitals Group     BP      Pulse      Resp      Temp      Temp src      SpO2      Weight      Height      Head Circumference      Peak Flow      Pain Score      Pain Loc      Pain Edu?      Excl. in GC?    No data found.  Updated Vital Signs BP (!) 95/59 (BP Location: Left Arm)   Pulse 82   Temp 98.2 F (36.8 C) (Oral)   Resp 18   SpO2 99%   Visual Acuity Right Eye Distance:   Left Eye Distance:   Bilateral Distance:    Right Eye Near:   Left Eye Near:    Bilateral Near:     Physical Exam Vitals and nursing note reviewed.  Constitutional:      Appearance: He is well-developed and well-nourished.     Comments: No acute distress  HENT:     Head: Normocephalic and atraumatic.     Ears:  Comments: Bilateral ears without tenderness to palpation of external auricle, tragus and mastoid, EAC's without erythema or swelling, TM's with good bony landmarks and cone of light. Non erythematous.     Nose: Nose normal.     Mouth/Throat:     Comments: Oral mucosa pink and moist, no tonsillar enlargement or exudate. Posterior pharynx patent and nonerythematous, no uvula deviation or swelling. Normal phonation. Eyes:     Conjunctiva/sclera: Conjunctivae normal.  Cardiovascular:     Rate and Rhythm: Normal rate and regular rhythm.  Pulmonary:     Effort: Pulmonary effort is normal. No respiratory distress.     Comments: Breathing comfortably at rest, CTABL, no wheezing, rales or other adventitious sounds auscultated Abdominal:     General: There is no distension.  Musculoskeletal:        General: Normal range of motion.     Cervical back: Neck supple.  Skin:    General: Skin is warm and dry.  Neurological:     Mental Status: He is alert and oriented to person, place, and  time.  Psychiatric:        Mood and Affect: Mood and affect normal.      UC Treatments / Results  Labs (all labs ordered are listed, but only abnormal results are displayed) Labs Reviewed  NOVEL CORONAVIRUS, NAA    EKG   Radiology No results found.  Procedures Procedures (including critical care time)  Medications Ordered in UC Medications - No data to display  Initial Impression / Assessment and Plan / UC Course  I have reviewed the triage vital signs and the nursing notes.  Pertinent labs & imaging results that were available during my care of the patient were reviewed by me and considered in my medical decision making (see chart for details).     Viral illness-exam reassuring, blood pressure at baseline, recommending symptomatic and supportive care with rest fluids and anti-inflammatories, repeat Covid swab pending given prior test before symptom onset.  Discussed strict return precautions. Patient verbalized understanding and is agreeable with plan.  Final Clinical Impressions(s) / UC Diagnoses   Final diagnoses:  Generalized body aches  Acute nonintractable headache, unspecified headache type     Discharge Instructions     Covid swab pending, monitor MyChart for results Rest and fluids Ibuprofen and Tylenol for headache body aches Follow-up if not improving or worsening    ED Prescriptions    Medication Sig Dispense Auth. Provider   ibuprofen (ADVIL) 800 MG tablet Take 1 tablet (800 mg total) by mouth 3 (three) times daily. 21 tablet Stacye Noori, [place]ick[place] C, PA-C     PDMP not reviewed this encounter.   Lew Dawes, [place] 06/06/20 [phone]

## 2020-06-06 NOTE — Discharge Instructions (Addendum)
Covid swab pending, monitor MyChart for results Rest and fluids Ibuprofen and Tylenol for headache body aches Follow-up if not improving or worsening

## 2020-06-06 NOTE — ED Triage Notes (Signed)
Patient states he has had generalized body aches and a headache x 2 days. Pt states the headache was painful enough it kept him up last night. Pt is aox4 and ambulatory.

## 2020-06-07 LAB — NOVEL CORONAVIRUS, NAA: SARS-CoV-2, NAA: NOT DETECTED

## 2020-06-07 LAB — SARS-COV-2, NAA 2 DAY TAT

## 2021-05-25 IMAGING — CR DG CHEST 2V
2 series · 2 of 2 positions shown · non-contrast
Comparison: 07/07/2006

CLINICAL DATA: Shortness of breath

EXAM:
CHEST - 2 VIEW

[w chest pa]
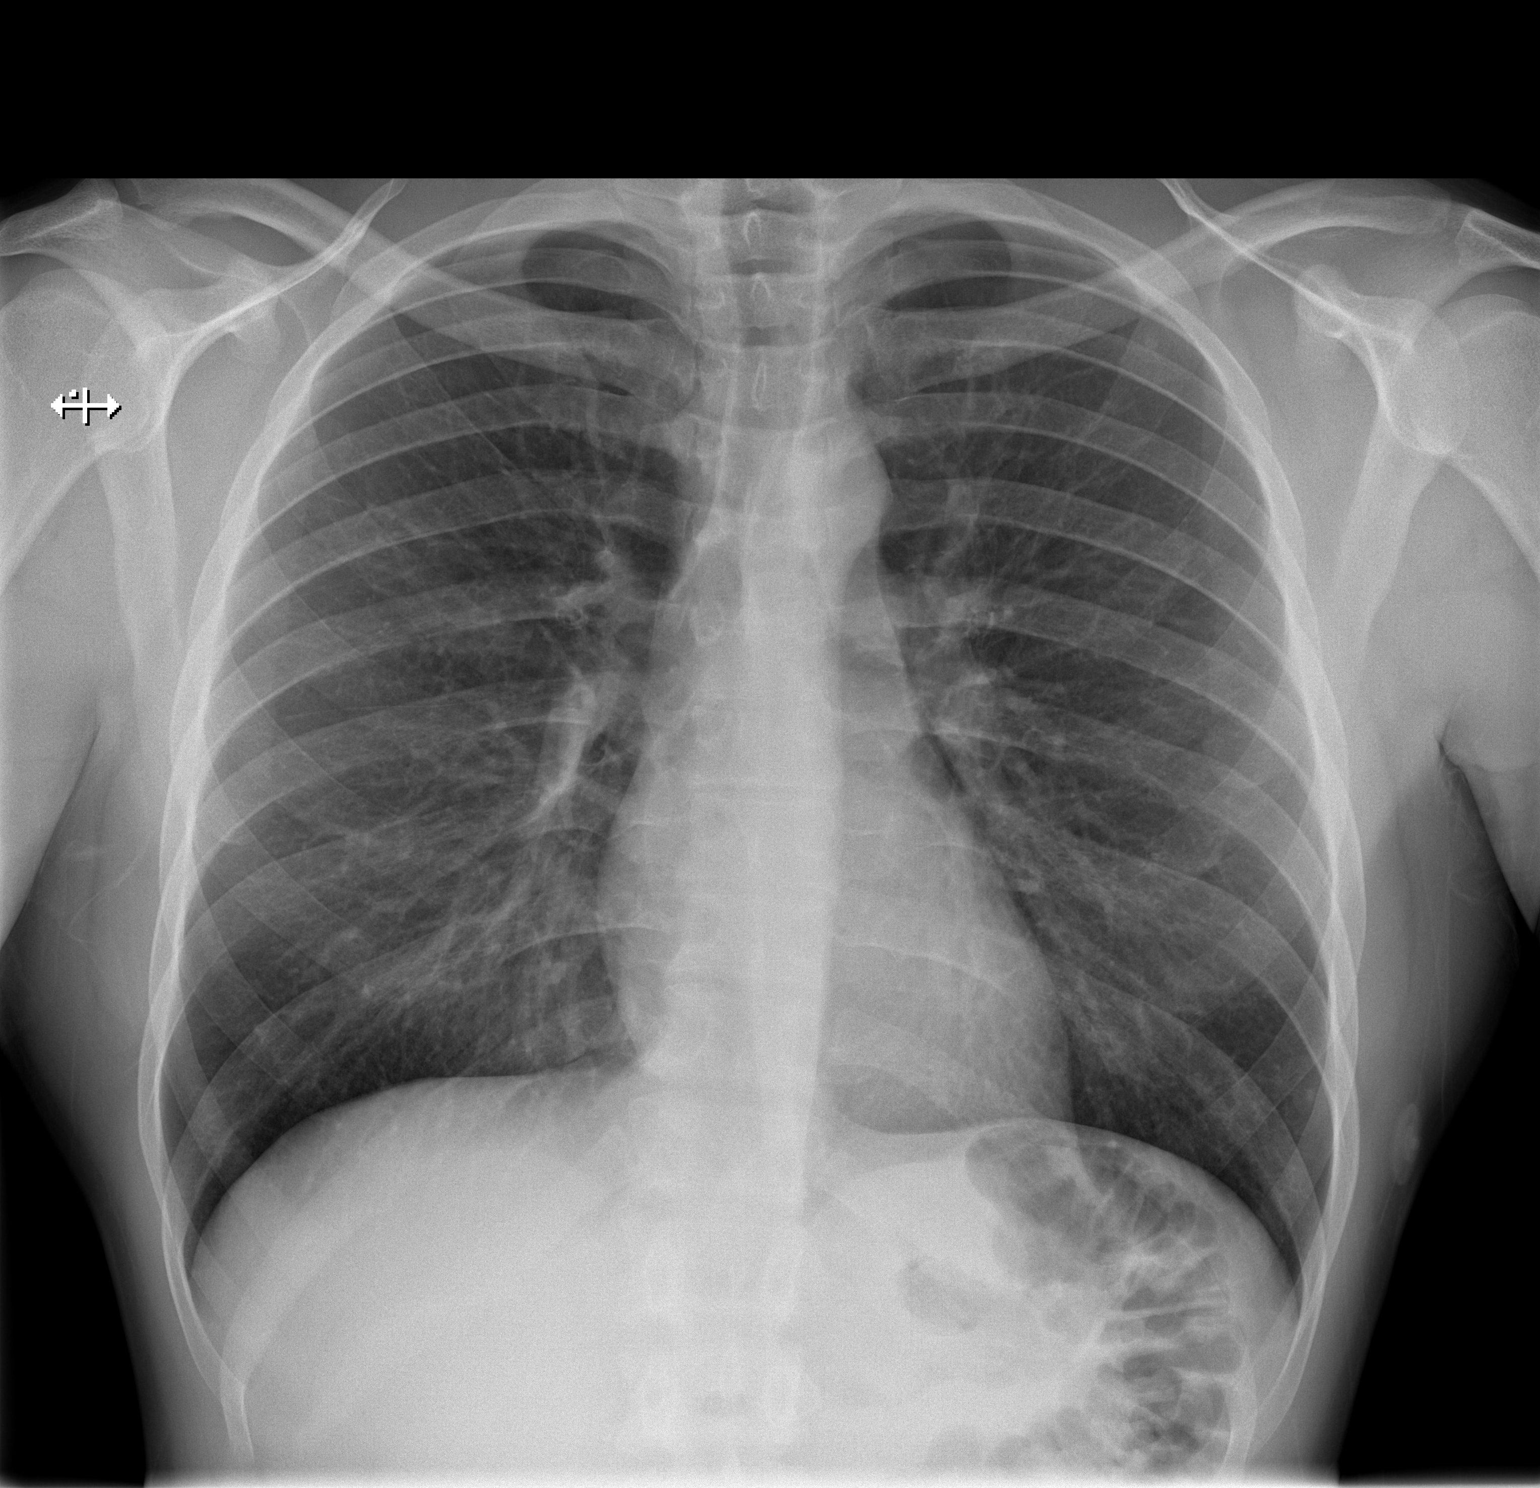

[w chest lat]
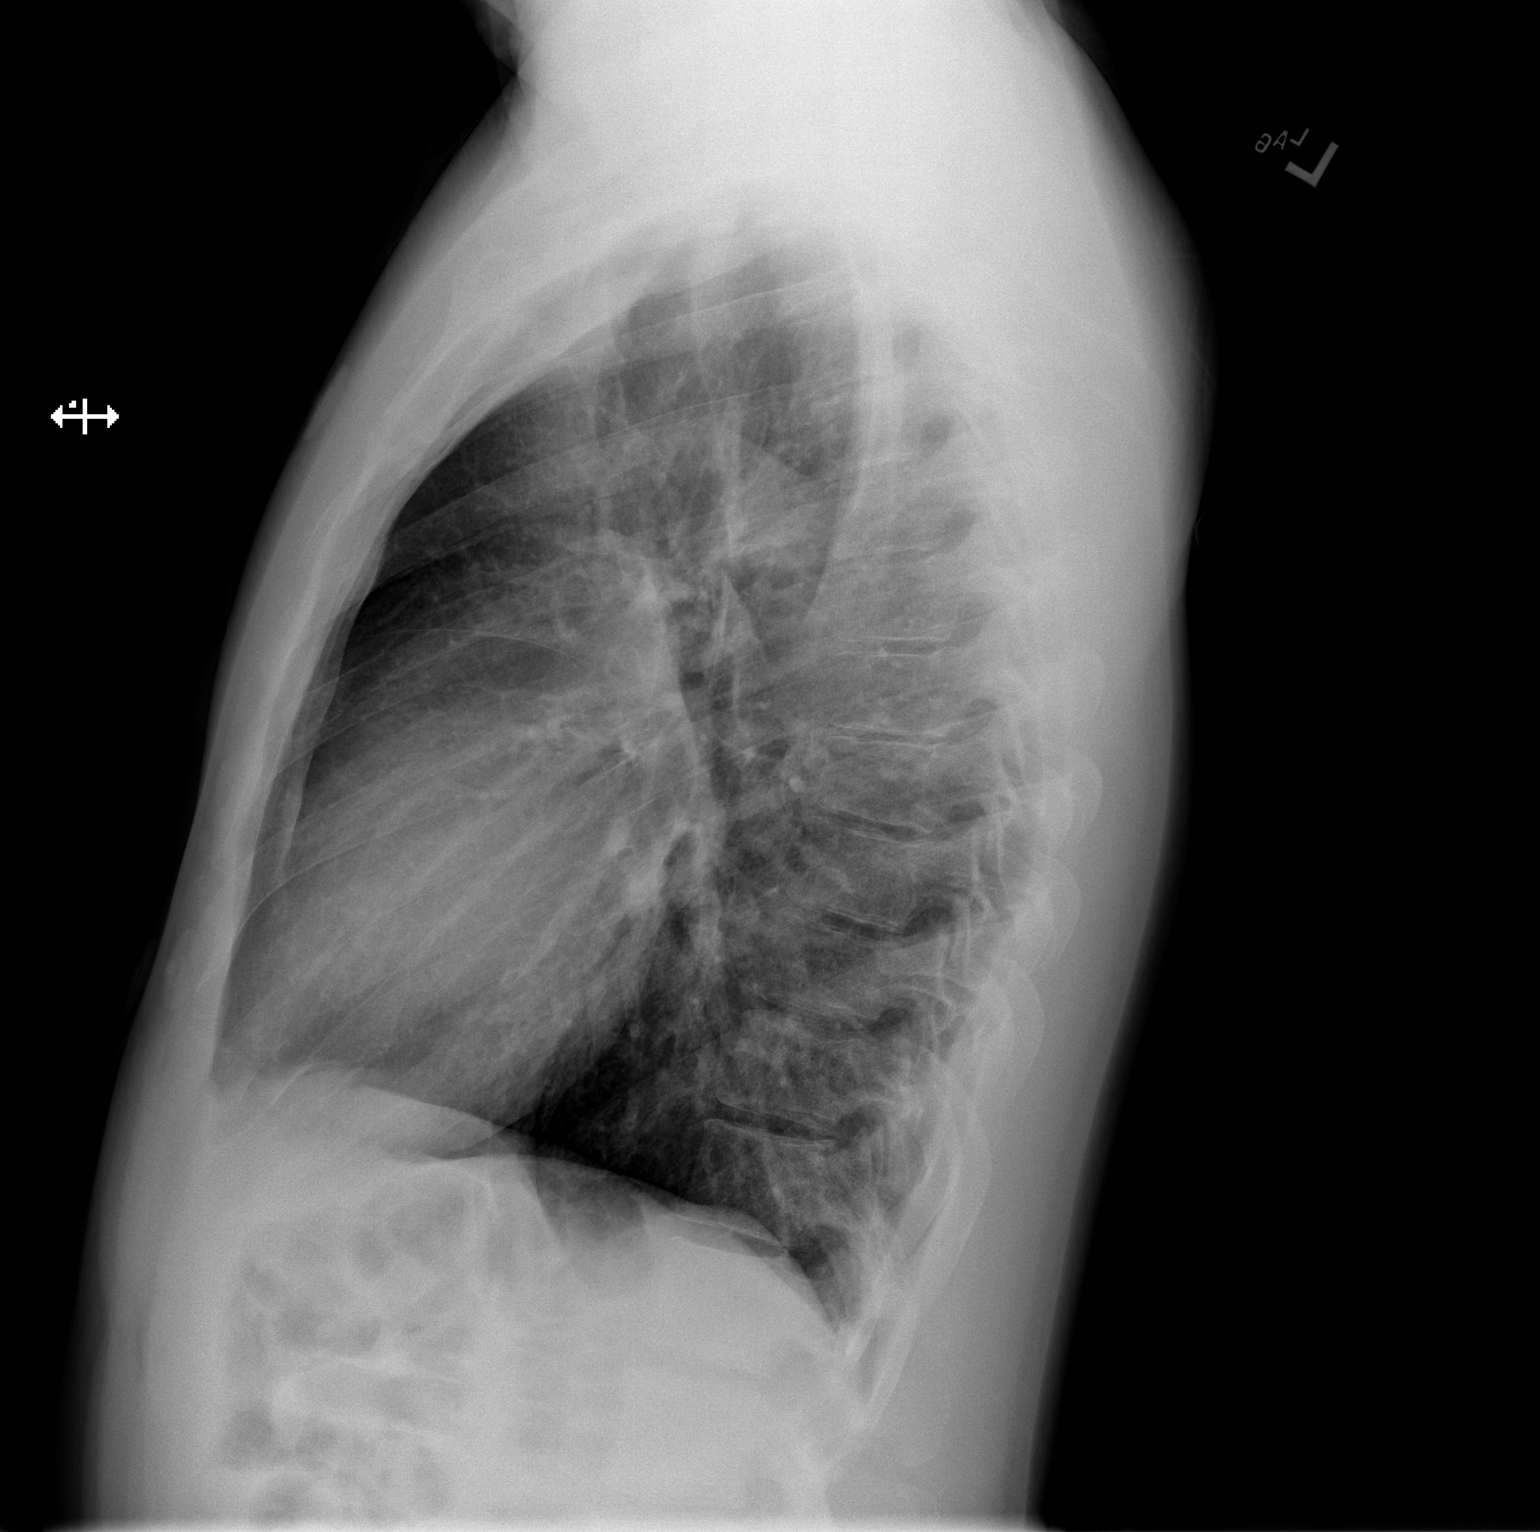

[2 of 2 positions shown; findings below may reference images not displayed]

FINDINGS: The heart size and mediastinal contours are within normal limits.
Both lungs are clear. The visualized skeletal structures are
unremarkable.
IMPRESSION: No active cardiopulmonary disease.

## 2021-05-28 ENCOUNTER — Emergency Department (HOSPITAL_COMMUNITY)
Admission: EM | Admit: 2021-05-28 | Discharge: 2021-05-28 | Disposition: A | Discharge status: Home / Self Care | Payer: Self-pay | Attending: Emergency Medicine | Admitting: Emergency Medicine

## 2021-05-28 ENCOUNTER — Other Ambulatory Visit: Payer: Self-pay

## 2021-05-28 DIAGNOSIS — R369 Urethral discharge, unspecified: Secondary | ICD-10-CM | POA: Insufficient documentation

## 2021-05-28 LAB — URINALYSIS, ROUTINE W REFLEX MICROSCOPIC
Bilirubin Urine: NEGATIVE
Glucose, UA: NEGATIVE mg/dL
Hgb urine dipstick: NEGATIVE
Ketones, ur: NEGATIVE mg/dL
Nitrite: NEGATIVE
Protein, ur: NEGATIVE mg/dL
Specific Gravity, Urine: 1.02 (ref 1.005–1.030)
pH: 7 (ref 5.0–8.0)

## 2021-05-28 LAB — URINALYSIS, MICROSCOPIC (REFLEX): Squamous Epithelial / HPF: NONE SEEN (ref 0–5)

## 2021-05-28 MED ORDER — CEFTRIAXONE SODIUM 500 MG IJ SOLR
500.0000 mg | Freq: Once | INTRAMUSCULAR | Status: AC
  Administered 2021-05-28: 500 mg via INTRAMUSCULAR
  Filled 2021-05-28: qty 500

## 2021-05-28 MED ORDER — DOXYCYCLINE HYCLATE 100 MG PO TABS: 100.0000 mg | ORAL_TABLET | Freq: Two times a day (BID) | ORAL | 0 refills | Status: AC

## 2021-05-28 MED ORDER — LIDOCAINE HCL (PF) 1 % IJ SOLN
1.0000 mL | Freq: Once | INTRAMUSCULAR | Status: AC
  Administered 2021-05-28: 1 mL
  Filled 2021-05-28: qty 5

## 2021-05-28 NOTE — ED Provider Notes (Signed)
[facility] EMERGENCY DEPARTMENT Provider Note   CSN: [id number] Arrival date & time: 05/28/21  [id number]     History  Chief Complaint  Patient presents with   SEXUALLY TRANSMITTED DISEASE    Zachary Robinson is a 31 y.o. male.  HPI Patient is a 31 year old male  He is presented emergency room today with complaints of penile discharge for the past 2 days he has unprotected sex with an ex partner over the past several days to a week.  Denies any pain currently.  States that he has not had any abdominal pain chest pain shortness of breath lightheadedness dizziness fevers or testicular pain      Home Medications Prior to Admission medications   Medication Sig Start Date End Date Taking? Authorizing Provider  doxycycline (VIBRA-TABS) 100 MG tablet Take 1 tablet (100 mg total) by mouth 2 (two) times daily for 7 days. 05/28/21 06/04/21 Yes Sausha Raymond S, PA  ibuprofen (ADVIL) 800 MG tablet Take 1 tablet (800 mg total) by mouth 3 (three) times daily. 06/06/20   Wieters, Hallie C, PA-C      Allergies    Patient has no known allergies.    Review of Systems   Review of Systems  Physical Exam Updated Vital Signs BP 131/80 (BP Location: Right Arm)    Pulse 71    Temp 97.8 F (36.6 C) (Oral)    Resp 16    Ht 5\' 10"  (1.778 m)    Wt 77.1 kg    SpO2 99%    BMI 24.39 kg/m  Physical Exam Vitals and nursing note reviewed.  Constitutional:      General: He is not in acute distress. HENT:     Head: Normocephalic and atraumatic.     Nose: Nose normal.     Mouth/Throat:     Mouth: Mucous membranes are moist.  Eyes:     General: No scleral icterus. Cardiovascular:     Rate and Rhythm: Normal rate and regular rhythm.     Pulses: Normal pulses.     Heart sounds: Normal heart sounds.  Pulmonary:     Effort: Pulmonary effort is normal. No respiratory distress.     Breath sounds: No wheezing.  Abdominal:     Palpations: Abdomen is soft.     Tenderness: There is no  abdominal tenderness. There is no guarding or rebound.  Genitourinary:    Comments: Penis and testes without abnormality. Musculoskeletal:     Cervical back: Normal range of motion.     Right lower leg: No edema.     Left lower leg: No edema.  Skin:    General: Skin is warm and dry.     Capillary Refill: Capillary refill takes less than 2 seconds.  Neurological:     Mental Status: He is alert. Mental status is at baseline.  Psychiatric:        Mood and Affect: Mood normal.        Behavior: Behavior normal.    ED Results / Procedures / Treatments   Labs (all labs ordered are listed, but only abnormal results are displayed) Labs Reviewed  URINALYSIS, ROUTINE W REFLEX MICROSCOPIC  GC/CHLAMYDIA PROBE AMP ([facility]) NOT AT [facility]    EKG None  Radiology No results found.  Procedures Procedures    Medications Ordered in ED Medications  cefTRIAXone (ROCEPHIN) injection 500 mg (has no administration in time range)  lidocaine (PF) (XYLOCAINE) 1 % injection 1 mL (has no administration in time  range)    ED Course/ Medical Decision Making/ A&P                           Medical Decision Making Amount and/or Complexity of Data Reviewed Labs: ordered.  Risk Prescription drug management.   Patient is a 31 year old male  He is presented emergency room today with complaints of penile discharge for the past 2 days he has unprotected sex with an ex partner over the past several days to a week.  Denies any pain currently.  States that he has not had any abdominal pain chest pain shortness of breath lightheadedness dizziness fevers or testicular pain  Physical exam unremarkableWe will test for gonorrhea chlamydia and recommend patient follow-up with health department for further testing and consultation/education.   Lengthy discussion and education administered.  Patient will be refrain from sexual intercourse for the next 2 weeks.  Doxycycline at discharge given 1 dose of  Rocephin.  Urinalysis ordered as well as GC chlamydia  You have been treated in the emergency department for an infection, possibly sexually transmitted. Results of your gonorrhea and chlamydia tests are pending and you will be notified if they are positive. Please refrain from intercourse for 7 days and until all sex partners (within previous 60 days) are evaluated and/or treated as well. Please follow up with your primary care provider for continued care and further STD evaluation.  It is very important to practice safe sex and use condoms when sexually active. If your results are positive you need to notify all sexual partners so they can be treated as well. The website https://dean.info/ can be used to send anonymous text messages or emails to alert sexual contacts. Follow up with your doctor, or OBGYN in regards to today's visit.   INSTRUCTIONS BELOW.   Gonorrhea and Chlamydia SYMPTOMS  In females, symptoms may go unnoticed. Symptoms that are more noticeable can include:  Belly (abdominal) pain.  Painful intercourse.  Watery mucous-like discharge from the vagina.  Miscarriage.  Discomfort when urinating.  Inflammation of the rectum.  Abnormal gray-green frothy vaginal discharge  Vaginal itching and irritatio  Itching and irritation of the area outside the vagina.   Painful urination.  Bleeding after sexual intercourse.  In males, symptoms include:  Burning with urination.  Pain in the testicles.  Watery mucous-like discharge from the penis.  It can cause longstanding (chronic) pelvic pain after frequent infections.  TREATMENT  PID can cause women to not be able to have children (sterile) if left untreated or if half-treated.  It is important to finish ALL medications given to you.  This is a sexually transmitted infection. So you are also at risk for other sexually transmitted diseases, including HIV (AIDS), it is recommended that you get tested. HOME CARE INSTRUCTIONS   Warning: This infection is contagious. Do not have sex until treatment is completed. Follow up at your caregiver's office or the clinic to which you were referred. If your diagnosis (learning what is wrong) is confirmed by culture or some other method, your recent sexual contacts need treatment. Even if they are symptom free or have a negative culture or evaluation, they should be treated.  PREVENTION  Women should use sanitary pads instead of tampons for vaginal discharge.  Wipe front to back after using the toilet and avoid douching.   Practice safe sex, use condoms, have only one sex partner and be sure your sex partner is not having sex with  others.  Ask your caregiver to test you for chlamydia at your regular checkups or sooner if you are having symptoms.  Ask for further information if you are pregnant.  SEEK IMMEDIATE MEDICAL CARE IF:  You develop an oral temperature above 102 F (38.9 C), not controlled by medications or lasting more than 2 days.  You develop an increase in pain.  You develop any type of abnormal discharge.  You develop vaginal bleeding and it is not time for your period.  You develop painful intercourse.   Bacterial Vaginosis  Bacterial vaginosis (BV) is a vaginal infection where the normal balance of bacteria in the vagina is disrupted. This is not a sexually transmitted disease and your sexual partners do NOT need to be treated. CAUSES  The cause of BV is not fully understood. BV develops when there is an increase or imbalance of harmful bacteria.  Some activities or behaviors can upset the normal balance of bacteria in the vagina and put women at increased risk including:  Having a new sex partner or multiple sex partners.  Douching.  Using an intrauterine device (IUD) for contraception.  It is not clear what role sexual activity plays in the development of BV. However, women that have never had sexual intercourse are rarely infected with BV.  Women do not get  BV from toilet seats, bedding, swimming pools or from touching objects around them.   SYMPTOMS  Grey vaginal discharge.  A fish-like odor with discharge, especially after sexual intercourse.  Itching or burning of the vagina and vulva.  Burning or pain with urination.  Some women have no signs or symptoms at all.   TREATMENT  Sometimes BV will clear up without treatment.  BV may be treated with antibiotics.  BV can recur after treatment. If this happens, a second round of antibiotics will often be prescribed.  HOME CARE INSTRUCTIONS  Finish all medication as directed by your caregiver.  Do not have sex until treatment is completed.  Do NOT drink any alcoholic beverages while being treated  with Metronidazole (Flagyl). This will cause a severe reaction inducing vomiting.  RESOURCE GUIDE  Dental Problems  Patients with Medicaid: [place] [place] [facility] Phone:  [phone]                                                  Phone:  [phone]  If unable to pay or uninsured, contact:  [facility] or [facility]. to become qualified for the adult dental clinic.  Chronic Pain Problems Contact [facility]  [phone] Patients need to be referred by their primary care doctor.  Insufficient Money for Medicine Contact [employer]:  call "211" or [place] [phone].  No Primary Care Doctor Call [facility]  [phone] Other agencies that provide inexpensive medical care    [facility]  [facility]  [phone]  [place]  [phone]    [facility]  [phone]    [facility]  [place]  [phone] [place]   [phone] (emergency  services [phone])  [facility]  [phone] [facility] [phone] [facility] [place] [facility] [phone] Residential & Outpatient Substance Abuse Program  [phone]  Abuse/Neglect [place] [phone] [place] [phone] (After Hours)  Emergency [place] [phone]  [place] at the [place] [phone] [place] [phone]  MRSA Hotline #:   819 210 1149    [facility] Dept. [address]      [place] Phone:  [phone]                                   Phone:  [phone]                 Phone:  [place] Phone:  [place] [phone] (After Hours)    Final Clinical Impression(s) / ED Diagnoses Final diagnoses:  Penile discharge    Rx / DC Orders ED Discharge Orders          Ordered    doxycycline (VIBRA-TABS) 100 MG tablet  2 times daily        05/28/21 0759              Tedd Sias, PA 05/28/21 [id number]    Elnora [place], MD 05/30/21 [phone]

## 2021-05-28 NOTE — Discharge Instructions (Signed)
You have been treated in the emergency department for an infection, possibly sexually transmitted. Results of your gonorrhea and chlamydia tests are pending and you will be notified if they are positive. Please refrain from intercourse for 14 days and until all sex partners (within previous 60 days) are evaluated and/or treated as well. Please follow up with your primary care provider for continued care and further STD evaluation.  It is very important to practice safe sex and use condoms when sexually active. If your results are positive you need to notify all sexual partners so they can be treated as well. The website [email or web address] can be used to send anonymous text messages or emails to alert sexual contacts. Follow up with your doctor, or OBGYN in regards to today's visit.   Gonorrhea and Chlamydia SYMPTOMS  In females, symptoms may go unnoticed. Symptoms that are more noticeable can include:  Belly (abdominal) pain.  Painful intercourse.  Watery mucous-like discharge from the vagina.  Miscarriage.  Discomfort when urinating.  Inflammation of the rectum.  Abnormal gray-green frothy vaginal discharge  Vaginal itching and irritatio  Itching and irritation of the area outside the vagina.   Painful urination.  Bleeding after sexual intercourse.  In males, symptoms include:  Burning with urination.  Pain in the testicles.  Watery mucous-like discharge from the penis.  It can cause longstanding (chronic) pelvic pain after frequent infections.  TREATMENT  PID can cause women to not be able to have children (sterile) if left untreated or if half-treated.  It is important to finish ALL medications given to you.  This is a sexually transmitted infection. So you are also at risk for other sexually transmitted diseases, including HIV (AIDS), it is recommended that you get tested. HOME CARE INSTRUCTIONS  Warning: This infection is contagious. Do not have sex until treatment is  completed. Follow up at your caregiver's office or the clinic to which you were referred. If your diagnosis (learning what is wrong) is confirmed by culture or some other method, your recent sexual contacts need treatment. Even if they are symptom free or have a negative culture or evaluation, they should be treated.  PREVENTION  Women should use sanitary pads instead of tampons for vaginal discharge.  Wipe front to back after using the toilet and avoid douching.   Practice safe sex, use condoms, have only one sex partner and be sure your sex partner is not having sex with others.  Ask your caregiver to test you for chlamydia at your regular checkups or sooner if you are having symptoms.  Ask for further information if you are pregnant.  SEEK IMMEDIATE MEDICAL CARE IF:  You develop an oral temperature above 102 F (38.9 C), not controlled by medications or lasting more than 2 days.  You develop an increase in pain.  You develop any type of abnormal discharge.  You develop vaginal bleeding and it is not time for your period.  You develop painful intercourse.   Bacterial Vaginosis  Bacterial vaginosis (BV) is a vaginal infection where the normal balance of bacteria in the vagina is disrupted. This is not a sexually transmitted disease and your sexual partners do NOT need to be treated. CAUSES  The cause of BV is not fully understood. BV develops when there is an increase or imbalance of harmful bacteria.  Some activities or behaviors can upset the normal balance of bacteria in the vagina and put women at increased risk including:  Having a new sex partner or multiple  sex partners.  Douching.  Using an intrauterine device (IUD) for contraception.  It is not clear what role sexual activity plays in the development of BV. However, women that have never had sexual intercourse are rarely infected with BV.  Women do not get BV from toilet seats, bedding, swimming pools or from touching objects around  them.   SYMPTOMS  Grey vaginal discharge.  A fish-like odor with discharge, especially after sexual intercourse.  Itching or burning of the vagina and vulva.  Burning or pain with urination.  Some women have no signs or symptoms at all.   TREATMENT  Sometimes BV will clear up without treatment.  BV may be treated with antibiotics.  BV can recur after treatment. If this happens, a second round of antibiotics will often be prescribed.  HOME CARE INSTRUCTIONS  Finish all medication as directed by your caregiver.  Do not have sex until treatment is completed.  Do NOT drink any alcoholic beverages while being treated  with Metronidazole (Flagyl). This will cause a severe reaction inducing vomiting.  RESOURCE GUIDE  Dental Problems  Patients with Medicaid: [place] Phone:  [phone]                                                  Phone:  [phone]  If unable to pay or uninsured, contact:  [facility] or [facility]. to become qualified for the adult dental clinic.  Chronic Pain Problems Contact [facility]  [phone] Patients need to be referred by their primary care doctor.  Insufficient Money for Medicine Contact [employer]:  call "211" or [place] [phone].  No Primary Care Doctor Call [facility]  [phone] Other agencies that provide inexpensive medical care    [place]  [phone]    [facility] Internal [facility]  [place]  [phone]    [facility]  [phone]    [facility]  [place]  [place]  [phone] [place]   [phone] (emergency services [phone])  [facility]   2346696215 [facility] [phone] [facility] [place] [phone] [place] [phone] Residential & Outpatient Substance Abuse Program  [phone]  Abuse/Neglect [place] [phone] [place] [phone] (After Hours)  Emergency [place] [phone]  [place] [facility] [place] [phone] [place] [phone]  MRSA Hotline #:   (570)527-1069    [place] Dept. [address]  [place]      [place] Phone:  [phone]                                   Phone:  [phone]                 Phone:  [place] Phone:  [place] [phone] (After Hours)

## 2021-05-28 NOTE — ED Triage Notes (Signed)
Pt reports yellow/green penile discharge this am after unprotected sex this weekend. Denies pain.

## 2022-08-04 ENCOUNTER — Ambulatory Visit (HOSPITAL_COMMUNITY)
Admission: EM | Admit: 2022-08-04 | Discharge: 2022-08-04 | Disposition: A | Discharge status: Home / Self Care | Payer: 59 | Attending: Internal Medicine | Admitting: Internal Medicine

## 2022-08-04 ENCOUNTER — Encounter (HOSPITAL_COMMUNITY): Payer: Self-pay | Admitting: Emergency Medicine

## 2022-08-04 DIAGNOSIS — Z1152 Encounter for screening for COVID-19: Secondary | ICD-10-CM | POA: Insufficient documentation

## 2022-08-04 DIAGNOSIS — J029 Acute pharyngitis, unspecified: Secondary | ICD-10-CM | POA: Insufficient documentation

## 2022-08-04 LAB — POC INFLUENZA A AND B ANTIGEN (URGENT CARE ONLY)
INFLUENZA A ANTIGEN, POC: NEGATIVE
INFLUENZA B ANTIGEN, POC: NEGATIVE

## 2022-08-04 LAB — SARS CORONAVIRUS 2 (TAT 6-24 HRS): SARS Coronavirus 2: NEGATIVE

## 2022-08-04 LAB — POCT RAPID STREP A, ED / UC: Streptococcus, Group A Screen (Direct): NEGATIVE

## 2022-08-04 NOTE — Discharge Instructions (Signed)
Please maintain adequate hydration Your strep and flu tests are negative Please take Tylenol or ibuprofen as needed for pain and/or fever We have sent COVID-19 testing.  We will call you if COVID test is abnormal Return to urgent care if you have any other concerns.

## 2022-08-04 NOTE — ED Triage Notes (Signed)
Pt reports had sore throat, right ear pain, and body aches for a couple days. Took theraflu max.

## 2022-08-05 NOTE — ED Provider Notes (Signed)
[facility]    CSN: [id number] Arrival date & time: 08/04/22  0945      History   Chief Complaint Chief Complaint  Patient presents with   Generalized Body Aches   Sore Throat    HPI Zachary Robinson is a 32 y.o. male comes to urgent care with 2-day history of sore throat, generalized body aches and right ear pain.  Symptoms have been persistent.  It is associated with subjective fever and chills.  Patient took TheraFlu with no improvement in symptoms.  He denies any sick contacts.  No shortness of breath, cough or sputum production.  Patient denies any history of COVID-19 infection.  He has not been vaccinated against COVID-19.  No nausea, vomiting or diarrhea.   HPI  Past Medical History:  Diagnosis Date   Asthma     There are no problems to display for this patient.   History reviewed. No pertinent surgical history.     Home Medications    Prior to Admission medications   Medication Sig Start Date End Date Taking? Authorizing Provider  ibuprofen (ADVIL) 800 MG tablet Take 1 tablet (800 mg total) by mouth 3 (three) times daily. 06/06/20   Wieters, Junius Creamer, PA-C    Family History Family History  Problem Relation Age of Onset   Healthy Mother     Social History Social History   Tobacco Use   Smoking status: Every Day   Smokeless tobacco: Never  Vaping Use   Vaping Use: Never used  Substance Use Topics   Alcohol use: No   Drug use: No     Allergies   Patient has no known allergies.   Review of Systems Review of Systems As per HPI  Physical Exam Triage Vital Signs ED Triage Vitals  Enc Vitals Group     BP 08/04/22 1039 120/72     Pulse Rate 08/04/22 1039 76     Resp 08/04/22 1039 16     Temp 08/04/22 1039 98.7 F (37.1 C)     Temp Source 08/04/22 1039 Oral     SpO2 08/04/22 1039 97 %     Weight --      Height --      Head Circumference --      Peak Flow --      Pain Score 08/04/22 1038 2     Pain Loc --      Pain Edu?  --      Excl. in GC? --    No data found.  Updated Vital Signs BP 120/72 (BP Location: Left Arm)   Pulse 76   Temp 98.7 F (37.1 C) (Oral)   Resp 16   SpO2 97%   Visual Acuity Right Eye Distance:   Left Eye Distance:   Bilateral Distance:    Right Eye Near:   Left Eye Near:    Bilateral Near:     Physical Exam Vitals and nursing note reviewed.  Constitutional:      General: He is not in acute distress.    Appearance: He is not ill-appearing.  HENT:     Right Ear: Tympanic membrane normal.     Left Ear: Tympanic membrane normal.     Mouth/Throat:     Mouth: Mucous membranes are moist.     Pharynx: Posterior oropharyngeal erythema present. No pharyngeal swelling.     Tonsils: 1+ on the right. 1+ on the left.  Cardiovascular:     Rate and Rhythm: Normal rate  and regular rhythm.     Heart sounds: Normal heart sounds.  Pulmonary:     Effort: Pulmonary effort is normal.     Breath sounds: Normal breath sounds.  Neurological:     Mental Status: He is alert.      UC Treatments / Results  Labs (all labs ordered are listed, but only abnormal results are displayed) Labs Reviewed  SARS CORONAVIRUS 2 (TAT 6-24 HRS)  POC INFLUENZA A AND B ANTIGEN (URGENT CARE ONLY)  POCT RAPID STREP A, ED / UC    EKG   Radiology No results found.  Procedures Procedures (including critical care time)  Medications Ordered in UC Medications - No data to display  Initial Impression / Assessment and Plan / UC Course  I have reviewed the triage vital signs and the nursing notes.  Pertinent labs & imaging results that were available during my care of the patient were reviewed by me and considered in my medical decision making (see chart for details).     1.  Acute viral pharyngitis: Point-of-care strep is negative Point-of-care flu is negative COVID-19 PCR test has been sent Warm salt water gargle recommended Ibuprofen as needed for pain and/or fever Maintain adequate  hydration Return precautions given Final Clinical Impressions(s) / UC Diagnoses   Final diagnoses:  Acute viral pharyngitis     Discharge Instructions      Please maintain adequate hydration Your strep and flu tests are negative Please take Tylenol or ibuprofen as needed for pain and/or fever We have sent COVID-19 testing.  We will call you if COVID test is abnormal Return to urgent care if you have any other concerns.   ED Prescriptions   None    PDMP not reviewed this encounter.   Merrilee Jansky, MD 08/05/22 1539

## 2023-01-10 ENCOUNTER — Encounter (HOSPITAL_COMMUNITY): Payer: Self-pay

## 2023-01-10 ENCOUNTER — Ambulatory Visit (HOSPITAL_COMMUNITY)
Admission: EM | Admit: 2023-01-10 | Discharge: 2023-01-10 | Disposition: A | Discharge status: Home / Self Care | Payer: 59 | Attending: Internal Medicine | Admitting: Internal Medicine

## 2023-01-10 DIAGNOSIS — M25539 Pain in unspecified wrist: Secondary | ICD-10-CM

## 2023-01-10 DIAGNOSIS — M25532 Pain in left wrist: Secondary | ICD-10-CM | POA: Diagnosis not present

## 2023-01-10 MED ORDER — IBUPROFEN 800 MG PO TABS: 800.0000 mg | ORAL_TABLET | Freq: Three times a day (TID) | ORAL | 0 refills | Status: AC

## 2023-01-10 NOTE — ED Provider Notes (Signed)
[facility]    CSN: [id number] Arrival date & time: 01/10/23  1028      History   Chief Complaint Chief Complaint  Patient presents with   Wrist Pain    HPI ELAINE BASORE is a 32 y.o. male.   TAISON SERR is a 32 y.o. male presenting for chief complaint of pain to the ulnar aspect of the left wrist that started abruptly this morning on waking. No recent trauma/injuries to the left wrist, no history of carpal tunnel syndrome, and no numbness/tingling distally to pain. Pain is localized to the ulnar aspect of the left wrist and radiates to the left fifth finger. Pain is triggered by extension of the left wrist, flexion does not trigger pain. He believes he may have slept with his wrist in a hyperflexed position last night causing pain. 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 No rash, fever/chills, redness to overlying skin, or pain to the elbow. Has not attempted use of any OTC medications to help with symptoms PTA.    Wrist Pain    Past Medical History:  Diagnosis Date   Asthma     There are no problems to display for this patient.   History reviewed. No pertinent surgical history.     Home Medications    Prior to Admission medications   Medication Sig Start Date End Date Taking? Authorizing Provider  ibuprofen (ADVIL) 800 MG tablet Take 1 tablet (800 mg total) by mouth 3 (three) times daily. 01/10/23   Carlisle Beers, FNP    Family History Family History  Problem Relation Age of Onset   Healthy Mother     Social History Social History   Tobacco Use   Smoking status: Every Day   Smokeless tobacco: Never  Vaping Use   Vaping status: Never Used  Substance Use Topics   Alcohol use: No   Drug use: No     Allergies   Patient has no known allergies.   Review of Systems Review of Systems Per HPI  Physical Exam Triage Vital Signs ED Triage Vitals  Encounter Vitals Group     BP  01/10/23 1102 (!) 102/59     Systolic BP Percentile --      Diastolic BP Percentile --      Pulse Rate 01/10/23 1102 61     Resp 01/10/23 1102 16     Temp 01/10/23 1102 98.3 F (36.8 C)     Temp Source 01/10/23 1102 Oral     SpO2 01/10/23 1102 98 %     Weight 01/10/23 1100 178 lb (80.7 kg)     Height 01/10/23 1100 6' (1.829 m)     Head Circumference --      Peak Flow --      Pain Score 01/10/23 1100 3     Pain Loc --      Pain Education --      Exclude from Growth Chart --    No data found.  Updated Vital Signs BP (!) 102/59 (BP Location: Left Arm)   Pulse 61   Temp 98.3 F (36.8 C) (Oral)   Resp 16   Ht 6' (1.829 m)   Wt 178 lb (80.7 kg)   SpO2 98%   BMI 24.14 kg/m   Visual Acuity Right Eye Distance:   Left Eye Distance:   Bilateral Distance:    Right Eye Near:   Left Eye Near:    Bilateral Near:  Physical Exam Vitals and nursing note reviewed.  Constitutional:      Appearance: He is not ill-appearing or toxic-appearing.  HENT:     Head: Normocephalic and atraumatic.     Right Ear: Hearing and external ear normal.     Left Ear: Hearing and external ear normal.     Nose: Nose normal.     Mouth/Throat:     Lips: Pink.  Eyes:     General: Lids are normal. Vision grossly intact. Gaze aligned appropriately.     Extraocular Movements: Extraocular movements intact.     Conjunctiva/sclera: Conjunctivae normal.  Pulmonary:     Effort: Pulmonary effort is normal.  Musculoskeletal:     Left wrist: Tenderness (TTP to the ulnar aspect of the left wrist) present. No swelling, deformity, effusion, lacerations, bony tenderness, snuff box tenderness or crepitus. Decreased range of motion (decreased ROM with hyperextension secondary to pain, flexion ROM normal). Normal pulse (+2 left bilateral ulnar and radial pulses).     Left hand: Normal.     Cervical back: Neck supple.     Comments: Phalens test negative bilaterally. Sensation intact distally. 5/5 grip strength  left hand. Less than 2 cap refill distally.   Skin:    General: Skin is warm and dry.     Capillary Refill: Capillary refill takes less than 2 seconds.     Findings: No rash.  Neurological:     General: No focal deficit present.     Mental Status: He is alert and oriented to person, place, and time. Mental status is at baseline.     Cranial Nerves: No dysarthria or facial asymmetry.  Psychiatric:        Mood and Affect: Mood normal.        Speech: Speech normal.        Behavior: Behavior normal.        Thought Content: Thought content normal.        Judgment: Judgment normal.      UC Treatments / Results  Labs (all labs ordered are listed, but only abnormal results are displayed) Labs Reviewed - No data to display  EKG   Radiology No results found.  Procedures Procedures (including critical care time)  Medications Ordered in UC Medications - No data to display  Initial Impression / Assessment and Plan / UC Course  I have reviewed the triage vital signs and the nursing notes.  Pertinent labs & imaging results that were available during my care of the patient were reviewed by me and considered in my medical decision making (see chart for details).   1. Pain ulnar side of wrist Presentation suggestive of radicular ulnar pain caused by malpositioning overnight while asleep. Deferred imaging of the left wrist due to stable MSK exam findings, atraumatic mechanism of injury/pain. Left wrist brace applied in clinic to prevent nerve irritation with ROM. Advised to wear this during the day for the next few days and at bedtime ongoing. Ibuprofen 800mg  every 8 hours as needed for pain and inflammation. Walking referral to orthopedics provided should symptoms fail to improve in the next few days.  Counseled patient on potential for adverse effects with medications prescribed/recommended today, strict ER and return-to-clinic precautions discussed, patient verbalized understanding.     Final Clinical Impressions(s) / UC Diagnoses   Final diagnoses:  Pain of ulnar side of wrist     Discharge Instructions      Take ibuprofen every 8 hours as needed for pain.  Your pain  is likely due to compression of the ulnar nerve from how you slept. Wear the wrist brace over the next 3 to 4 days and at nighttime ongoing to prevent your wrist from bending and prevent symptoms from happening. If symptoms do not improve in the next 3 to 4 days, call [facility] to schedule an appointment for follow-up. You may also find that applying ice 20 minutes on 20 minutes off to the wrist may help with symptoms further.  If you develop any new or worsening symptoms or if your symptoms do not start to improve, please return here or follow-up with your primary care provider. If your symptoms are severe, please go to the emergency room.      ED Prescriptions     Medication Sig Dispense Auth. Provider   ibuprofen (ADVIL) 800 MG tablet Take 1 tablet (800 mg total) by mouth 3 (three) times daily. 21 tablet Carlisle Beers, FNP      PDMP not reviewed this encounter.   Carlisle Beers, [place] 01/10/23 1335

## 2023-01-10 NOTE — Discharge Instructions (Signed)
Take ibuprofen every 8 hours as needed for pain.  Your pain is likely due to compression of the ulnar nerve from how you slept. Wear the wrist brace over the next 3 to 4 days and at nighttime ongoing to prevent your wrist from bending and prevent symptoms from happening. If symptoms do not improve in the next 3 to 4 days, call [facility] to schedule an appointment for follow-up. You may also find that applying ice 20 minutes on 20 minutes off to the wrist may help with symptoms further.  If you develop any new or worsening symptoms or if your symptoms do not start to improve, please return here or follow-up with your primary care provider. If your symptoms are severe, please go to the emergency room.

## 2023-01-10 NOTE — ED Triage Notes (Signed)
Patient here today with c/o left wrist pain upon waking this morning. No known injury. Patient states that he has increased pain whenever he tries to make a fist and bend his 5th finger.

## 2023-03-04 ENCOUNTER — Other Ambulatory Visit: Payer: Self-pay

## 2023-03-04 ENCOUNTER — Emergency Department (HOSPITAL_COMMUNITY)
Admission: EM | Admit: 2023-03-04 | Discharge: 2023-03-04 | Disposition: A | Discharge status: Home / Self Care | Payer: 59 | Attending: Emergency Medicine | Admitting: Emergency Medicine

## 2023-03-04 DIAGNOSIS — R799 Abnormal finding of blood chemistry, unspecified: Secondary | ICD-10-CM | POA: Diagnosis not present

## 2023-03-04 DIAGNOSIS — R899 Unspecified abnormal finding in specimens from other organs, systems and tissues: Secondary | ICD-10-CM | POA: Diagnosis not present

## 2023-03-04 LAB — HIV ANTIBODY (ROUTINE TESTING W REFLEX): HIV Screen 4th Generation wRfx: NONREACTIVE

## 2023-03-04 MED ORDER — PENICILLIN G BENZATHINE 1200000 UNIT/2ML IM SUSY
2.4000 10*6.[IU] | PREFILLED_SYRINGE | Freq: Once | INTRAMUSCULAR | Status: AC
  Administered 2023-03-04: 2.4 10*6.[IU] via INTRAMUSCULAR
  Filled 2023-03-04: qty 4

## 2023-03-04 NOTE — ED Provider Notes (Signed)
[place] EMERGENCY DEPARTMENT AT [facility] Provider Note   CSN: [id number] Arrival date & time: 03/04/23  1259    History  Chief Complaint  Patient presents with   Abnormal Lab    Zachary Robinson is a 32 y.o. male here for evaluation of abnormal lab.  States he donates weekly for blood plasma.  Donated twice last week, normal labs  He donated earlier this week and he received a letter in the mail that his testing was positive for syphilis. They told him he needed to be retested for confirmation.  He denies any history of similar.  No rashes, lesions, fever, dysuria, hematuria, arthralgias, sore throat, pain with bowel movements, AMS.  No recent unprotected sexual encounters  HPI     Home Medications Prior to Admission medications   Not on File      Allergies    Patient has no known allergies.    Review of Systems   Review of Systems  Constitutional: Negative.   HENT: Negative.    Respiratory: Negative.    Cardiovascular: Negative.   Gastrointestinal: Negative.   Genitourinary: Negative.   Musculoskeletal: Negative.   Skin: Negative.   Neurological: Negative.   All other systems reviewed and are negative.   Physical Exam Updated Vital Signs BP 109/64 (BP Location: Right Arm)   Pulse 67   Temp 98.9 F (37.2 C)   Resp 18   SpO2 100%  Physical Exam Vitals and nursing note reviewed.  Constitutional:      General: He is not in acute distress.    Appearance: He is well-developed. He is not ill-appearing or diaphoretic.  HENT:     Head: Atraumatic.  Eyes:     Pupils: Pupils are equal, round, and reactive to light.  Cardiovascular:     Rate and Rhythm: Normal rate and regular rhythm.  Pulmonary:     Effort: Pulmonary effort is normal. No respiratory distress.  Abdominal:     General: There is no distension.     Palpations: Abdomen is soft.  Genitourinary:    Comments: declined Musculoskeletal:        General: Normal range of motion.      Cervical back: Normal range of motion and neck supple.  Skin:    General: Skin is warm and dry.  Neurological:     General: No focal deficit present.     Mental Status: He is alert and oriented to person, place, and time.     ED Results / Procedures / Treatments   Labs (all labs ordered are listed, but only abnormal results are displayed) Labs Reviewed  RPR  HIV ANTIBODY (ROUTINE TESTING W REFLEX)  GC/CHLAMYDIA PROBE AMP () NOT AT [facility]    EKG None  Radiology No results found.  Procedures Procedures    Medications Ordered in ED Medications  penicillin g benzathine (BICILLIN LA) 1200000 UNIT/2ML injection 2.4 Million Units (2.4 Million Units Intramuscular Given 03/04/23 1651)    ED Course/ Medical Decision Making/ A&P   32 year old here for evaluation of abnormal lab.  Patient donates blood plasma.  Donated earlier this week and received a letter stating that he was positive for syphilis.  They recommended retesting.  He is currently asymptomatic.  He denies any history of similar.  Discussed retesting here in ED.  Discussed penicillin here for treatment while waiting on confirmation testing versus getting testing and following back up once resulted.  Requesting treatment here while awaiting confirmatory testing.  Patient will  follow-up with results on MyChart  The patient has been appropriately medically screened and/or stabilized in the ED. I have low suspicion for any other emergent medical condition which would require further screening, evaluation or treatment in the ED or require inpatient management.  Patient is hemodynamically stable and in no acute distress.  Patient able to ambulate in department prior to ED.  Evaluation does not show acute pathology that would require ongoing or additional emergent interventions while in the emergency department or further inpatient treatment.  I have discussed the diagnosis with the patient and answered all questions.   Pain is been managed while in the emergency department and patient has no further complaints prior to discharge.  Patient is comfortable with plan discussed in room and is stable for discharge at this time.  I have discussed strict return precautions for returning to the emergency department.  Patient was encouraged to follow-up with PCP/specialist refer to at discharge.                                   Medical Decision Making Amount and/or Complexity of Data Reviewed External Data Reviewed: labs and notes. Labs: ordered. Decision-making details documented in ED Course.  Risk Prescription drug management. Parenteral controlled substances. Decision regarding hospitalization. Diagnosis or treatment significantly limited by social determinants of health.           Final Clinical Impression(s) / ED Diagnoses Final diagnoses:  Abnormal laboratory test    Rx / DC Orders ED Discharge Orders     None         Case Vassell A, PA-C 03/04/23 1657    Gloris Manchester, MD 03/04/23 2328

## 2023-03-04 NOTE — ED Triage Notes (Signed)
Pt here for STD screening. Donated plasma and had a +screen for syphilis. Denies any symptoms.

## 2023-03-04 NOTE — Discharge Instructions (Signed)
It was a pleasure taking care of you today.  We have sent your blood for retesting. We HAVE treated you as if the syphilis test was positive. You may follow up with the results on mychart or you will be called if they are positive.  Return for new or worsening symptoms

## 2023-03-04 NOTE — ED Notes (Signed)
PT states he is celibate and that he donates plasma weekly.  He stated that he his last blood results stated that he had tested positive for syphilis in the last week.

## 2023-03-05 LAB — GC/CHLAMYDIA PROBE AMP (~~LOC~~) NOT AT ARMC
Chlamydia: NEGATIVE
Comment: NEGATIVE
Comment: NORMAL
Neisseria Gonorrhea: NEGATIVE

## 2023-03-05 LAB — RPR: RPR Ser Ql: NONREACTIVE

## 2023-12-15 ENCOUNTER — Other Ambulatory Visit: Payer: Self-pay

## 2023-12-15 ENCOUNTER — Encounter (HOSPITAL_COMMUNITY): Payer: Self-pay | Admitting: Emergency Medicine

## 2023-12-15 ENCOUNTER — Ambulatory Visit (HOSPITAL_COMMUNITY)
Admission: EM | Admit: 2023-12-15 | Discharge: 2023-12-15 | Disposition: A | Discharge status: Home / Self Care | Payer: Self-pay

## 2023-12-15 DIAGNOSIS — J069 Acute upper respiratory infection, unspecified: Secondary | ICD-10-CM

## 2023-12-15 DIAGNOSIS — R0981 Nasal congestion: Secondary | ICD-10-CM

## 2023-12-15 NOTE — ED Provider Notes (Signed)
 [facility]    CSN: [id number] Arrival date & time: 12/15/23  1600      History   Chief Complaint Chief Complaint  Patient presents with   URI    HPI Zachary Robinson  is a 33 y.o. male.  Here with 1 day history of runny nose, nasal congestion Reports temp 99 last night No sore throat, cough, abd pain, NVD, rash Possible sick contacts at work. Daughter sick recently too  No interventions attempted Doesn't like to take medications   Past Medical History:  Diagnosis Date   Asthma     There are no active problems to display for this patient.   History reviewed. No pertinent surgical history.     Home Medications    Prior to Admission medications   Medication Sig Start Date End Date Taking? Authorizing Provider  ibuprofen  (ADVIL ) 800 MG tablet Take 1 tablet (800 mg total) by mouth 3 (three) times daily. 01/10/23   Enedelia Dorna HERO, FNP    Family History Family History  Problem Relation Age of Onset   Healthy Mother     Social History Social History   Tobacco Use   Smoking status: Every Day   Smokeless tobacco: Never  Vaping Use   Vaping status: Never Used  Substance Use Topics   Alcohol use: No   Drug use: Yes    Types: Marijuana     Allergies   Patient has no known allergies.   Review of Systems Review of Systems As per HPI  Physical Exam Triage Vital Signs ED Triage Vitals  Encounter Vitals Group     BP 12/15/23 1742 106/69     Girls Systolic BP Percentile --      Girls Diastolic BP Percentile --      Boys Systolic BP Percentile --      Boys Diastolic BP Percentile --      Pulse Rate 12/15/23 1742 64     Resp 12/15/23 1742 18     Temp 12/15/23 1742 98 F (36.7 C)     Temp Source 12/15/23 1742 Oral     SpO2 12/15/23 1742 99 %     Weight --      Height --      Head Circumference --      Peak Flow --      Pain Score 12/15/23 1739 6     Pain Loc --      Pain Education --      Exclude from Growth Chart --     No data found.  Updated Vital Signs BP 106/69 (BP Location: Right Arm)   Pulse 64   Temp 98 F (36.7 C) (Oral)   Resp 18   SpO2 99%    Physical Exam Vitals and nursing note reviewed.  Constitutional:      Appearance: He is not ill-appearing.  HENT:     Right Ear: Tympanic membrane and ear canal normal.     Left Ear: Tympanic membrane and ear canal normal.     Nose: Congestion and rhinorrhea present.     Mouth/Throat:     Mouth: Mucous membranes are moist.     Pharynx: Oropharynx is clear. No posterior oropharyngeal erythema.  Eyes:     Conjunctiva/sclera: Conjunctivae normal.  Cardiovascular:     Rate and Rhythm: Normal rate and regular rhythm.     Pulses: Normal pulses.     Heart sounds: Normal heart sounds.  Pulmonary:     Effort: Pulmonary effort  is normal.     Breath sounds: Normal breath sounds.  Musculoskeletal:     Cervical back: Normal range of motion.  Lymphadenopathy:     Cervical: No cervical adenopathy.  Skin:    General: Skin is warm and dry.  Neurological:     Mental Status: He is alert and oriented to person, place, and time.     UC Treatments / Results  Labs (all labs ordered are listed, but only abnormal results are displayed) Labs Reviewed - No data to display   EKG   Radiology No results found.  Procedures Procedures (including critical care time)  Medications Ordered in UC Medications - No data to display  Initial Impression / Assessment and Plan / UC Course  I have reviewed the triage vital signs and the nursing notes.  Pertinent labs & imaging results that were available during my care of the patient were reviewed by me and considered in my medical decision making (see chart for details).  Declines covid test today Afebrile in clinic Discussed viral etiology, supportive care, OTC options Advised likely prognosis Note for work provided Return precautions  Final Clinical Impressions(s) / UC Diagnoses   Final diagnoses:   Nasal congestion  Viral URI     Discharge Instructions      Guaifenesin (brand name Mucinex) Use extra strength tablets twice daily for congestion Continue hydration as this medicine only works with fluids!  I also recommend once a day cetirizine (brand name Zyrtec) for runny nose  You can get these medications at any store -- and store brand will be the cheapest. The [facility] will have the most affordable prices.   You may have 4-5 days of symptoms before improvement Please return if needed      ED Prescriptions   None    PDMP not reviewed this encounter.   Jeryl Asberry RIGGERS 12/15/23 1940

## 2023-12-15 NOTE — Discharge Instructions (Signed)
 Guaifenesin (brand name Mucinex) Use extra strength tablets twice daily for congestion Continue hydration as this medicine only works with fluids!  I also recommend once a day cetirizine (brand name Zyrtec) for runny nose  You can get these medications at any store -- and store brand will be the cheapest. The [facility] will have the most affordable prices.   You may have 4-5 days of symptoms before improvement Please return if needed

## 2023-12-15 NOTE — ED Triage Notes (Signed)
 Symptoms started yesterday.  Runny nose, stuffy head.  Intermittent fever  Denies taking medication for symptoms.  States he gets these symptoms every year and its a upper respiratory infection
